# Patient Record
Sex: Male | Born: 1965
Health system: Southern US, Community
[De-identification: ages and names within clinical notes are randomized; demographics above are authoritative.]

## PROBLEM LIST (undated history)

## (undated) DIAGNOSIS — I10 Essential (primary) hypertension: Secondary | ICD-10-CM

## (undated) DIAGNOSIS — E785 Hyperlipidemia, unspecified: Secondary | ICD-10-CM

## (undated) DIAGNOSIS — E669 Obesity, unspecified: Secondary | ICD-10-CM

## (undated) DIAGNOSIS — G43909 Migraine, unspecified, not intractable, without status migrainosus: Secondary | ICD-10-CM

## (undated) DIAGNOSIS — E1169 Type 2 diabetes mellitus with other specified complication: Secondary | ICD-10-CM

## (undated) DIAGNOSIS — M7502 Adhesive capsulitis of left shoulder: Secondary | ICD-10-CM

## (undated) HISTORY — DX: Migraine, unspecified, not intractable, without status migrainosus: G43.909

## (undated) HISTORY — DX: Type 2 diabetes mellitus with other specified complication: E66.9

## (undated) HISTORY — DX: Obesity, unspecified: E11.69

## (undated) HISTORY — PX: KIDNEY SURGERY: SHX687

## (undated) HISTORY — DX: Hyperlipidemia, unspecified: E78.5

---
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
## 2017-09-04 ENCOUNTER — Ambulatory Visit (INDEPENDENT_AMBULATORY_CARE_PROVIDER_SITE_OTHER): Payer: 59 | Admitting: Podiatry

## 2017-09-04 ENCOUNTER — Encounter: Payer: Self-pay | Admitting: Podiatry

## 2017-09-04 DIAGNOSIS — M79676 Pain in unspecified toe(s): Secondary | ICD-10-CM

## 2017-09-04 DIAGNOSIS — L600 Ingrowing nail: Secondary | ICD-10-CM

## 2017-09-04 NOTE — Progress Notes (Signed)
  Subjective:  Patient ID: Nathan Brandt, male    DOB: 11-Aug-1965,  MRN: [id number]  Chief Complaint  Patient presents with  . Nail Problem    bilateral great toenails thick and curling together  \ 52 y.o. male presents with the above complaint.  Reports pain to both great toenails.  Reports the nails are very thick and painful.  Nondiabetic.  Has tried to cut his nails himself without relief.  Denies nausea vomiting fever chills. No past medical history on file.  No current outpatient medications on file.  Allergies no known allergies Review of Systems Objective:  There were no vitals filed for this visit. General AA&O x3. Normal mood and affect.  Vascular Dorsalis pedis and posterior tibial pulses  present 2+ bilaterally  Capillary refill normal to all digits. Pedal hair growth normal.  Neurologic Epicritic sensation grossly present.  Dermatologic No open lesions. Interspaces clear of maceration. 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  Pain to palpation about medial lateral nail borders bilaterally.  Orthopedic: MMT 5/5 in dorsiflexion, plantarflexion, inversion, and eversion. Normal joint ROM without pain or crepitus.   Assessment & Plan:  Patient was evaluated and treated and all questions answered.  Pincer nails bilateral great toes, right worse than left -Discussed performing permanent nail avulsion of both borders of the right great toenail due to pincer nail deformity.  Patient understands and wishes to proceed.  See below.  Will hold off performing procedure on left foot at this time, slant back performed at both nail borders, will consider at next visit performing similar procedure should the nails be painful.  Procedure: Excision of Ingrown Toenail Location: Right 1st toe both nail borders. Anesthesia: Lidocaine 1% plain; 1.665mL and Marcaine 0.5% plain; 1.655mL, digital block. Skin Prep: Betadine. Dressing: Silvadene; telfa; dry, sterile,  compression dressing. Technique: Following skin prep, the toe was exsanguinated and a tourniquet was secured at the base of the toe. The affected nail border was freed, split with a nail splitter, and excised. Chemical matrixectomy was then performed with phenol and irrigated out with alcohol. The tourniquet was then removed and sterile dressing applied. Disposition: Patient tolerated procedure well. Patient to return in 2 weeks for follow-up.   Return in about 2 weeks (around 09/18/2017) for Nail Check.

## 2017-09-04 NOTE — Patient Instructions (Signed)

## 2017-09-19 ENCOUNTER — Ambulatory Visit: Payer: 59 | Admitting: Podiatry

## 2017-11-27 ENCOUNTER — Encounter: Payer: Self-pay | Admitting: Medical

## 2017-11-27 ENCOUNTER — Ambulatory Visit (INDEPENDENT_AMBULATORY_CARE_PROVIDER_SITE_OTHER): Payer: 59 | Admitting: Medical

## 2017-11-27 VITALS — BP 155/90 | HR 72 | Temp 97.8°F | Resp 16 | Ht 63.0 in | Wt 246.4 lb

## 2017-11-27 DIAGNOSIS — Z125 Encounter for screening for malignant neoplasm of prostate: Secondary | ICD-10-CM | POA: Diagnosis not present

## 2017-11-27 DIAGNOSIS — Z0000 Encounter for general adult medical examination without abnormal findings: Secondary | ICD-10-CM | POA: Diagnosis not present

## 2017-11-27 DIAGNOSIS — Z23 Encounter for immunization: Secondary | ICD-10-CM | POA: Diagnosis not present

## 2017-11-27 DIAGNOSIS — Z1211 Encounter for screening for malignant neoplasm of colon: Secondary | ICD-10-CM | POA: Diagnosis not present

## 2017-11-27 DIAGNOSIS — Z113 Encounter for screening for infections with a predominantly sexual mode of transmission: Secondary | ICD-10-CM | POA: Diagnosis not present

## 2017-11-27 NOTE — Progress Notes (Signed)
Subjective:    Patient ID: Nathan Brandt, male    DOB: 1965-10-27, 52 y.o.   MRN: [id number]  HPI   Pt here for first time.  Pt  Wife works for [employer]. Pt is doing interior trim work around area presently. He is starting new job at [employer] upcoming week. Pt is from [place]. Pt is not exercising apart from work and yard work. Pt admits not eating healthy. Non smoker. 2 glasses of wine every 3 months.  Pt bp high initially today. Pt never had high bp before.  Pt due for tdap. He is willing to get today.  Pt never had colonoscopy.  Pt ok with getting hiv screen.   Review of Systems  Constitutional: Negative for chills, fatigue and fever.  HENT: Negative for congestion, ear discharge, facial swelling and hearing loss.   Respiratory: Negative for cough, chest tightness and wheezing.   Cardiovascular: Negative for chest pain and palpitations.  Gastrointestinal: Negative for abdominal distention, abdominal pain, constipation, nausea and vomiting.  Endocrine: Negative for polydipsia, polyphagia and polyuria.  Genitourinary: Negative for difficulty urinating, dysuria, frequency, hematuria, penile pain, penile swelling and urgency.  Musculoskeletal: Negative for arthralgias and back pain.  Skin: Negative for rash.  Neurological: Negative for dizziness, syncope, speech difficulty, weakness, numbness and headaches.       Occasional migraine ha since his 20's. No current ha.  Describes some neck tension and light sensitivity.  Hematological: Negative for adenopathy. Does not bruise/bleed easily.  Psychiatric/Behavioral: Negative for behavioral problems, confusion and sleep disturbance. The patient is not nervous/anxious.     History reviewed. No pertinent past medical history.   Social History   Socioeconomic History  . Marital status: Unknown    Spouse name: Not on file  . Number of children: Not on file  . Years of education: Not on file  . Highest education level: Not on  file  Occupational History  . Not on file  Social Needs  . Financial resource strain: Not on file  . Food insecurity:    Worry: Not on file    Inability: Not on file  . Transportation needs:    Medical: Not on file    Non-medical: Not on file  Tobacco Use  . Smoking status: Never Smoker  . Smokeless tobacco: Never Used  Substance and Sexual Activity  . Alcohol use: Never    Frequency: Never  . Drug use: Never  . Sexual activity: Not on file  Lifestyle  . Physical activity:    Days per week: Not on file    Minutes per session: Not on file  . Stress: Not on file  Relationships  . Social connections:    Talks on phone: Not on file    Gets together: Not on file    Attends religious service: Not on file    Active member of club or organization: Not on file    Attends meetings of clubs or organizations: Not on file    Relationship status: Not on file  . Intimate partner violence:    Fear of current or ex partner: Not on file    Emotionally abused: Not on file    Physically abused: Not on file    Forced sexual activity: Not on file  Other Topics Concern  . Not on file  Social History Narrative  . Not on file    Past Surgical History:  Procedure Laterality Date  . KIDNEY SURGERY      Family History  Problem Relation Age of Onset  . Cancer Mother   . Diabetes Father     No Known Allergies  No current outpatient medications on file prior to visit.   No current facility-administered medications on file prior to visit.     BP (!) 173/93   Pulse 72   Temp 97.8 F (36.6 C) (Oral)   Resp 16   Ht 5\' 3"  (1.6 m)   Wt 246 lb 6.4 oz (111.8 kg)   SpO2 99%   BMI 43.65 kg/m      Objective:   Physical Exam  General Mental Status- Alert. General Appearance- Not in acute distress.   Skin General: Color- Normal Color. Moisture- Normal Moisture.  Neck Carotid Arteries- Normal color. Moisture- Normal Moisture. No carotid bruits. No JVD.  Chest and Lung  Exam Auscultation: Breath Sounds:-Normal.  Cardiovascular Auscultation:Rythm- Regular. Murmurs & Other Heart Sounds:Auscultation of the heart reveals- No Murmurs.  Abdomen Inspection:-Inspeection Normal. Palpation/Percussion:Note:No mass. Palpation and Percussion of the abdomen reveal- Non Tender, Non Distended + BS, no rebound or guarding.    Neurologic Cranial Nerve exam:- CN III-XII intact(No nystagmus), symmetric smile. Finger to Nose:- Normal/Intact Strength:- 5/5 equal and symmetric strength both upper and lower extremities.  Genital exam- no hernias, normal testicles. Rectal- smooth, not enlarged prostate. No nodules. Stool card negative for blood.    Assessment & Plan:  For you wellness exam today I have ordered cbc, cmp, lipid panel, ua and hiv.(future labs. Schedule at check out)  Vaccine given today. tdap  Recommend exercise and healthy diet.  We will let you know lab results as they come in.  Follow up date appointment will be determined after lab review.   Recommend low salt diet, and weight loss. Recheck bp in 2 weeks. May need bp medication.  Esperanza Richters, PA-C

## 2017-11-27 NOTE — Patient Instructions (Addendum)
For you wellness exam today I have ordered cbc, cmp, lipid panel, ua and hiv.(future labs. Schedule at check out)  Vaccine given today. tdap  Recommend exercise and healthy diet.  We will let you know lab results as they come in.  Follow up date appointment will be determined after lab review.   Recommend low salt diet, and weight loss. Recheck bp in 2 weeks. May need bp medication.   Preventive Care 40-64 Years, Male Preventive care refers to lifestyle choices and visits with your health care provider that can promote health and wellness. What does preventive care include?  A yearly physical exam. This is also called an annual well check.  Dental exams once or twice a year.  Routine eye exams. Ask your health care provider how often you should have your eyes checked.  Personal lifestyle choices, including: ? Daily care of your teeth and gums. ? Regular physical activity. ? Eating a healthy diet. ? Avoiding tobacco and drug use. ? Limiting alcohol use. ? Practicing safe sex. ? Taking low-dose aspirin every day starting at age 60. What happens during an annual well check? The services and screenings done by your health care provider during your annual well check will depend on your age, overall health, lifestyle risk factors, and family history of disease. Counseling Your health care provider may ask you questions about your:  Alcohol use.  Tobacco use.  Drug use.  Emotional well-being.  Home and relationship well-being.  Sexual activity.  Eating habits.  Work and work Statistician.  Screening You may have the following tests or measurements:  Height, weight, and BMI.  Blood pressure.  Lipid and cholesterol levels. These may be checked every 5 years, or more frequently if you are over 52 years old.  Skin check.  Lung cancer screening. You may have this screening every year starting at age 90 if you have a 30-pack-year history of smoking and currently smoke  or have quit within the past 15 years.  Fecal occult blood test (FOBT) of the stool. You may have this test every year starting at age 48.  Flexible sigmoidoscopy or colonoscopy. You may have a sigmoidoscopy every 5 years or a colonoscopy every 10 years starting at age 42.  Prostate cancer screening. Recommendations will vary depending on your family history and other risks.  Hepatitis C blood test.  Hepatitis B blood test.  Sexually transmitted disease (STD) testing.  Diabetes screening. This is done by checking your blood sugar (glucose) after you have not eaten for a while (fasting). You may have this done every 1-3 years.  Discuss your test results, treatment options, and if necessary, the need for more tests with your health care provider. Vaccines Your health care provider may recommend certain vaccines, such as:  Influenza vaccine. This is recommended every year.  Tetanus, diphtheria, and acellular pertussis (Tdap, Td) vaccine. You may need a Td booster every 10 years.  Varicella vaccine. You may need this if you have not been vaccinated.  Zoster vaccine. You may need this after age 56.  Measles, mumps, and rubella (MMR) vaccine. You may need at least one dose of MMR if you were born in 1957 or later. You may also need a second dose.  Pneumococcal 13-valent conjugate (PCV13) vaccine. You may need this if you have certain conditions and have not been vaccinated.  Pneumococcal polysaccharide (PPSV23) vaccine. You may need one or two doses if you smoke cigarettes or if you have certain conditions.  Meningococcal vaccine.  You may need this if you have certain conditions.  Hepatitis A vaccine. You may need this if you have certain conditions or if you travel or work in places where you may be exposed to hepatitis A.  Hepatitis B vaccine. You may need this if you have certain conditions or if you travel or work in places where you may be exposed to hepatitis B.  Haemophilus  influenzae type b (Hib) vaccine. You may need this if you have certain risk factors.  Talk to your health care provider about which screenings and vaccines you need and how often you need them. This information is not intended to replace advice given to you by your health care provider. Make sure you discuss any questions you have with your health care provider. Document Released: 06/23/2015 Document Revised: 02/14/2016 Document Reviewed: 03/28/2015 Elsevier Interactive Patient Education  2018 [place] Eating Plan DASH stands for "Dietary Approaches to Stop Hypertension." The DASH eating plan is a healthy eating plan that has been shown to reduce high blood pressure (hypertension). It may also reduce your risk for type 2 diabetes, heart disease, and stroke. The DASH eating plan may also help with weight loss. What are tips for following this plan? General guidelines Avoid eating more than 2,300 mg (milligrams) of salt (sodium) a day. If you have hypertension, you may need to reduce your sodium intake to 1,500 mg a day. Limit alcohol intake to no more than 1 drink a day for nonpregnant women and 2 drinks a day for men. One drink equals 12 oz of beer, 5 oz of wine, or 1 oz of hard liquor. Work with your health care provider to maintain a healthy body weight or to lose weight. Ask what an ideal weight is for you. Get at least 30 minutes of exercise that causes your heart to beat faster (aerobic exercise) most days of the week. Activities may include walking, swimming, or biking. Work with your health care provider or diet and nutrition specialist (dietitian) to adjust your eating plan to your individual calorie needs. Reading food labels Check food labels for the amount of sodium per serving. Choose foods with less than 5 percent of the Daily Value of sodium. Generally, foods with less than 300 mg of sodium per serving fit into this eating plan. To find whole grains, look for the  word "whole" as the first word in the ingredient list. Shopping Buy products labeled as "low-sodium" or "no salt added." Buy fresh foods. Avoid canned foods and premade or frozen meals. Cooking Avoid adding salt when cooking. Use salt-free seasonings or herbs instead of table salt or sea salt. Check with your health care provider or pharmacist before using salt substitutes. Do not fry foods. Cook foods using healthy methods such as baking, boiling, grilling, and broiling instead. Cook with heart-healthy oils, such as olive, canola, soybean, or sunflower oil. Meal planning  Eat a balanced diet that includes: 5 or more servings of fruits and vegetables each day. At each meal, try to fill half of your plate with fruits and vegetables. Up to 6-8 servings of whole grains each day. Less than 6 oz of lean meat, poultry, or fish each day. A 3-oz serving of meat is about the same size as a deck of cards. One egg equals 1 oz. 2 servings of low-fat dairy each day. A serving of nuts, seeds, or beans 5 times each week. Heart-healthy fats. Healthy fats called Omega-3 fatty acids are  found in foods such as flaxseeds and coldwater fish, like sardines, salmon, and mackerel. Limit how much you eat of the following: Canned or prepackaged foods. Food that is high in trans fat, such as fried foods. Food that is high in saturated fat, such as fatty meat. Sweets, desserts, sugary drinks, and other foods with added sugar. Full-fat dairy products. Do not salt foods before eating. Try to eat at least 2 vegetarian meals each week. Eat more home-cooked food and less restaurant, buffet, and fast food. When eating at a restaurant, ask that your food be prepared with less salt or no salt, if possible. What foods are recommended? The items listed may not be a complete list. Talk with your dietitian about what dietary choices are best for you. Grains Whole-grain or whole-wheat bread. Whole-grain or whole-wheat pasta.  Brown rice. [place]. Bulgur. Whole-grain and low-sodium cereals. Pita bread. Low-fat, low-sodium crackers. Whole-wheat flour tortillas. Vegetables Fresh or frozen vegetables (raw, steamed, roasted, or grilled). Low-sodium or reduced-sodium tomato and vegetable juice. Low-sodium or reduced-sodium tomato sauce and tomato paste. Low-sodium or reduced-sodium canned vegetables. Fruits All fresh, dried, or frozen fruit. Canned fruit in natural juice (without added sugar). Meat and other protein foods Skinless chicken or [place]. Ground chicken or [place]. Pork with fat trimmed off. Fish and seafood. Egg whites. Dried beans, peas, or lentils. Unsalted nuts, nut butters, and seeds. Unsalted canned beans. Lean cuts of beef with fat trimmed off. Low-sodium, lean deli meat. Dairy Low-fat (1%) or fat-free (skim) milk. Fat-free, low-fat, or reduced-fat cheeses. Nonfat, low-sodium ricotta or cottage cheese. Low-fat or nonfat yogurt. Low-fat, low-sodium cheese. Fats and oils Soft margarine without trans fats. Vegetable oil. Low-fat, reduced-fat, or light mayonnaise and salad dressings (reduced-sodium). Canola, safflower, olive, soybean, and sunflower oils. Avocado. Seasoning and other foods Herbs. Spices. Seasoning mixes without salt. Unsalted popcorn and pretzels. Fat-free sweets. What foods are not recommended? The items listed may not be a complete list. Talk with your dietitian about what dietary choices are best for you. Grains Baked goods made with fat, such as croissants, muffins, or some breads. Dry pasta or rice meal packs. Vegetables Creamed or fried vegetables. Vegetables in a cheese sauce. Regular canned vegetables (not low-sodium or reduced-sodium). Regular canned tomato sauce and paste (not low-sodium or reduced-sodium). Regular tomato and vegetable juice (not low-sodium or reduced-sodium). Angie Fava. Olives. Fruits Canned fruit in a light or heavy syrup. Fried fruit. Fruit in cream or butter  sauce. Meat and other protein foods Fatty cuts of meat. Ribs. Fried meat. Berniece Salines. Sausage. Bologna and other processed lunch meats. Salami. Fatback. Hotdogs. Bratwurst. Salted nuts and seeds. Canned beans with added salt. Canned or smoked fish. Whole eggs or egg yolks. Chicken or [place] with skin. Dairy Whole or 2% milk, cream, and half-and-half. Whole or full-fat cream cheese. Whole-fat or sweetened yogurt. Full-fat cheese. Nondairy creamers. Whipped toppings. Processed cheese and cheese spreads. Fats and oils Butter. Stick margarine. Lard. Shortening. Ghee. Bacon fat. Tropical oils, such as coconut, palm kernel, or palm oil. Seasoning and other foods Salted popcorn and pretzels. Onion salt, garlic salt, seasoned salt, table salt, and sea salt. Worcestershire sauce. Tartar sauce. Barbecue sauce. Teriyaki sauce. Soy sauce, including reduced-sodium. Steak sauce. Canned and packaged gravies. Fish sauce. Oyster sauce. Cocktail sauce. Horseradish that you find on the shelf. Ketchup. Mustard. Meat flavorings and tenderizers. Bouillon cubes. Hot sauce and Tabasco sauce. Premade or packaged marinades. Premade or packaged taco seasonings. Relishes. Regular salad dressings. Where to find more information: [employer]  Heart, Lung, and Blood Institute: https://wilson-eaton.com/ American Heart Association: www.heart.org Summary The DASH eating plan is a healthy eating plan that has been shown to reduce high blood pressure (hypertension). It may also reduce your risk for type 2 diabetes, heart disease, and stroke. With the DASH eating plan, you should limit salt (sodium) intake to 2,300 mg a day. If you have hypertension, you may need to reduce your sodium intake to 1,500 mg a day. When on the DASH eating plan, aim to eat more fresh fruits and vegetables, whole grains, lean proteins, low-fat dairy, and heart-healthy fats. Work with your health care provider or diet and nutrition specialist (dietitian) to adjust your eating  plan to your individual calorie needs. This information is not intended to replace advice given to you by your health care provider. Make sure you discuss any questions you have with your health care provider. Document Released: 05/16/2011 Document Revised: 05/20/2016 Document Reviewed: 05/20/2016 Elsevier Interactive Patient Education  [employer].

## 2017-11-28 ENCOUNTER — Other Ambulatory Visit (INDEPENDENT_AMBULATORY_CARE_PROVIDER_SITE_OTHER): Payer: 59

## 2017-11-28 DIAGNOSIS — Z113 Encounter for screening for infections with a predominantly sexual mode of transmission: Secondary | ICD-10-CM

## 2017-11-28 DIAGNOSIS — Z125 Encounter for screening for malignant neoplasm of prostate: Secondary | ICD-10-CM

## 2017-11-28 DIAGNOSIS — Z0000 Encounter for general adult medical examination without abnormal findings: Secondary | ICD-10-CM | POA: Diagnosis not present

## 2017-11-28 LAB — CBC WITH DIFFERENTIAL/PLATELET
BASOS ABS: 0 10*3/uL (ref 0.0–0.1)
BASOS PCT: 0.9 % (ref 0.0–3.0)
Eosinophils Absolute: 0.2 10*3/uL (ref 0.0–0.7)
Eosinophils Relative: 4 % (ref 0.0–5.0)
HEMATOCRIT: 43.1 % (ref 39.0–52.0)
Hemoglobin: 14.8 g/dL (ref 13.0–17.0)
LYMPHS ABS: 1.4 10*3/uL (ref 0.7–4.0)
Lymphocytes Relative: 30.5 % (ref 12.0–46.0)
MCHC: 34.3 g/dL (ref 30.0–36.0)
MCV: 87.6 fl (ref 78.0–100.0)
MONOS PCT: 11.5 % (ref 3.0–12.0)
Monocytes Absolute: 0.5 10*3/uL (ref 0.1–1.0)
NEUTROS ABS: 2.4 10*3/uL (ref 1.4–7.7)
NEUTROS PCT: 53.1 % (ref 43.0–77.0)
PLATELETS: 240 10*3/uL (ref 150.0–400.0)
RBC: 4.92 Mil/uL (ref 4.22–5.81)
RDW: 13.7 % (ref 11.5–15.5)
WBC: 4.6 10*3/uL (ref 4.0–10.5)

## 2017-11-28 LAB — COMPREHENSIVE METABOLIC PANEL
ALT: 34 U/L (ref 0–53)
AST: 20 U/L (ref 0–37)
Albumin: 4.2 g/dL (ref 3.5–5.2)
Alkaline Phosphatase: 61 U/L (ref 39–117)
BILIRUBIN TOTAL: 0.5 mg/dL (ref 0.2–1.2)
BUN: 22 mg/dL (ref 6–23)
CHLORIDE: 105 meq/L (ref 96–112)
CO2: 28 mEq/L (ref 19–32)
CREATININE: 1.01 mg/dL (ref 0.40–1.50)
Calcium: 9.3 mg/dL (ref 8.4–10.5)
GFR: 82.86 mL/min (ref >60.00)
GLUCOSE: 103 mg/dL — AB (ref 70–99)
Potassium: 4 mEq/L (ref 3.5–5.1)
Sodium: 141 mEq/L (ref 135–145)
Total Protein: 6.8 g/dL (ref 6.0–8.3)

## 2017-11-28 LAB — LIPID PANEL
Cholesterol: 237 mg/dL — ABNORMAL HIGH (ref 0–200)
HDL: 56.3 mg/dL (ref >39.00)
LDL CALC: 160 mg/dL — AB (ref 0–99)
NONHDL: 181.09
Total CHOL/HDL Ratio: 4
Triglycerides: 106 mg/dL (ref 0.0–149.0)
VLDL: 21.2 mg/dL (ref 0.0–40.0)

## 2017-11-28 LAB — URINALYSIS, ROUTINE W REFLEX MICROSCOPIC
Bilirubin Urine: NEGATIVE
HGB URINE DIPSTICK: NEGATIVE
KETONES UR: NEGATIVE
Leukocytes, UA: NEGATIVE
NITRITE: NEGATIVE
Specific Gravity, Urine: 1.02 (ref 1.000–1.030)
TOTAL PROTEIN, URINE-UPE24: NEGATIVE
URINE GLUCOSE: NEGATIVE
Urobilinogen, UA: 0.2 (ref 0.0–1.0)
pH: 6.5 (ref 5.0–8.0)

## 2017-11-28 LAB — PSA: PSA: 1.24 ng/mL (ref 0.10–4.00)

## 2017-11-29 LAB — HIV ANTIBODY (ROUTINE TESTING W REFLEX): HIV: NONREACTIVE

## 2017-12-12 ENCOUNTER — Ambulatory Visit: Payer: 59 | Admitting: Medical

## 2017-12-18 ENCOUNTER — Encounter: Payer: Self-pay | Admitting: Medical

## 2017-12-18 ENCOUNTER — Ambulatory Visit (INDEPENDENT_AMBULATORY_CARE_PROVIDER_SITE_OTHER): Payer: 59 | Admitting: Medical

## 2017-12-18 VITALS — BP 160/105 | HR 66 | Temp 98.1°F | Resp 16 | Ht 63.0 in | Wt 243.4 lb

## 2017-12-18 DIAGNOSIS — I10 Essential (primary) hypertension: Secondary | ICD-10-CM | POA: Diagnosis not present

## 2017-12-18 DIAGNOSIS — E785 Hyperlipidemia, unspecified: Secondary | ICD-10-CM

## 2017-12-18 DIAGNOSIS — R739 Hyperglycemia, unspecified: Secondary | ICD-10-CM | POA: Diagnosis not present

## 2017-12-18 MED ORDER — LOSARTAN POTASSIUM 100 MG PO TABS: 100.0000 mg | ORAL_TABLET | Freq: Every day | ORAL | 3 refills | Status: DC

## 2017-12-18 NOTE — Progress Notes (Signed)
Subjective:    Patient ID: Nathan Brandt, male    DOB: 1965-09-30, 52 y.o.   MRN: [id number]  HPI  Pt in for bp check. His bp is still high. No cardiac or neurologic signs or symptoms. He has not checked bp since last visit.  Pt cholesterol was high last visit.Marland Kitchen He has improved diet but did not start fish oil.  Pt sugar was mild elevated recently. One time in past random sugar 238 about 2 years ago   Review of Systems  Constitutional: Negative for chills, fatigue and fever.  HENT: Negative for congestion, ear pain and facial swelling.   Respiratory: Negative for cough, chest tightness and wheezing.   Cardiovascular: Negative for chest pain and palpitations.  Gastrointestinal: Negative for abdominal distention and abdominal pain.  Musculoskeletal: Negative for back pain and neck pain.  Skin: Negative for rash.  Neurological: Negative for dizziness, numbness and headaches.  Hematological: Does not bruise/bleed easily.  Psychiatric/Behavioral: Negative for behavioral problems and confusion.    No past medical history on file.   Social History   Socioeconomic History  . Marital status: Unknown    Spouse name: Not on file  . Number of children: Not on file  . Years of education: Not on file  . Highest education level: Not on file  Occupational History  . Not on file  Social Needs  . Financial resource strain: Not on file  . Food insecurity:    Worry: Not on file    Inability: Not on file  . Transportation needs:    Medical: Not on file    Non-medical: Not on file  Tobacco Use  . Smoking status: Never Smoker  . Smokeless tobacco: Never Used  Substance and Sexual Activity  . Alcohol use: Yes    Frequency: Never    Comment: 2 glasses of wine every 3 months.  . Drug use: Never  . Sexual activity: Yes  Lifestyle  . Physical activity:    Days per week: Not on file    Minutes per session: Not on file  . Stress: Not on file  Relationships  . Social connections:   Talks on phone: Not on file    Gets together: Not on file    Attends religious service: Not on file    Active member of club or organization: Not on file    Attends meetings of clubs or organizations: Not on file    Relationship status: Not on file  . Intimate partner violence:    Fear of current or ex partner: Not on file    Emotionally abused: Not on file    Physically abused: Not on file    Forced sexual activity: Not on file  Other Topics Concern  . Not on file  Social History Narrative  . Not on file    Past Surgical History:  Procedure Laterality Date  . KIDNEY SURGERY    . KIDNEY SURGERY     left side surgery. 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    Family History  Problem Relation Age of Onset  . Cancer Mother   . Diabetes Father     No Known Allergies  No current outpatient medications on file prior to visit.   No current facility-administered medications on file prior to visit.     BP (!) 166/106   Pulse 66   Temp 98.1 F (36.7 C) (Oral)   Resp 16   Ht 5\' 3"  (1.6 m)   Wt 243 lb  6.4 oz (110.4 kg)   SpO2 98%   BMI 43.12 kg/m      Objective:   Physical Exam  General Mental Status- Alert. General Appearance- Not in acute distress.   Skin General: Color- Normal Color. Moisture- Normal Moisture.  Neck Carotid Arteries- Normal color. Moisture- Normal Moisture. No carotid bruits. No JVD.  Chest and Lung Exam Auscultation: Breath Sounds:-Normal.  Cardiovascular Auscultation:Rythm- Regular. Murmurs & Other Heart Sounds:Auscultation of the heart reveals- No Murmurs.  Abdomen Inspection:-Inspeection Normal. Palpation/Percussion:Note:No mass. Palpation and Percussion of the abdomen reveal- Non Tender, Non Distended + BS, no rebound or guarding.   Neurologic Cranial Nerve exam:- CN III-XII intact(No nystagmus), symmetric smile. tStrength:- 5/5 equal and symmetric strength both upper and lower extremities.      Assessment & Plan:    Your blood pressure is still high today.  I do think it is a good idea to start blood pressure medication to protect your heart, kidney function and eyes.  Reduce cardiovascular risk as well.  Your cholesterol has been high recently as well.  Please eat low-cholesterol diet and get fish oil tablets over-the-counter and we discussed.  Sugar has been mildly elevated recently and a moderate to high about 2 years ago.  So placed order to get A1c today.  Please get blood pressure cuff and start to check your blood pressure 2-3 times a week.  Check when relaxed.  Also check if you are symptomatic.  Asked that you schedule a nurse blood pressure check in 2 to 3 weeks.  We will review that the blood pressure reading and see if you need any modification.  Esperanza RichtersEdward Gadiel John, PA-C

## 2017-12-18 NOTE — Patient Instructions (Addendum)
Your blood pressure is still high today.  I do think it is a good idea to start blood pressure medication to protect your heart, kidney function and eyes.  Reduce cardiovascular risk as well.  Your cholesterol has been high recently as well.  Please eat low-cholesterol diet and get fish oil tablets over-the-counter and we discussed.  Sugar has been mildly elevated recently and a moderate to high about 2 years ago.  So placed order to get A1c today.  Please get blood pressure cuff and start to check your blood pressure 2-3 times a week.  Check when relaxed.  Also check if you are symptomatic.  Asked that you schedule a nurse blood pressure check in 2 to 3 weeks.  We will review that the blood pressure reading and see if you need any modification.

## 2017-12-19 ENCOUNTER — Other Ambulatory Visit (INDEPENDENT_AMBULATORY_CARE_PROVIDER_SITE_OTHER): Payer: 59

## 2017-12-19 ENCOUNTER — Telehealth: Payer: Self-pay

## 2017-12-19 DIAGNOSIS — R739 Hyperglycemia, unspecified: Secondary | ICD-10-CM | POA: Diagnosis not present

## 2017-12-19 DIAGNOSIS — I10 Essential (primary) hypertension: Secondary | ICD-10-CM

## 2017-12-19 LAB — HEMOGLOBIN A1C: HEMOGLOBIN A1C: 6.2 % (ref 4.6–6.5)

## 2017-12-19 MED ORDER — LOSARTAN POTASSIUM 100 MG PO TABS: 100.0000 mg | ORAL_TABLET | Freq: Every day | ORAL | 3 refills | Status: DC

## 2017-12-19 MED FILL — LOSARTAN POTASSIUM 100 MG T: 100 | 90 days supply | Qty: 90 | Fill #0

## 2017-12-19 NOTE — Telephone Encounter (Signed)
Thanks for sending rx. 

## 2017-12-19 NOTE — Telephone Encounter (Signed)
Losartan rx sent to [facility] per pt. request. Medication sent to [facility] on 7/11 cancelled by author over the phone.

## 2017-12-29 ENCOUNTER — Encounter: Payer: Self-pay | Admitting: Gastroenterology

## 2018-01-09 ENCOUNTER — Ambulatory Visit (INDEPENDENT_AMBULATORY_CARE_PROVIDER_SITE_OTHER): Payer: 59 | Admitting: Family Medicine

## 2018-01-09 VITALS — BP 134/89 | HR 76

## 2018-01-09 DIAGNOSIS — I10 Essential (primary) hypertension: Secondary | ICD-10-CM

## 2018-01-09 NOTE — Progress Notes (Signed)
Reviewed  Jamel Dunton R Lowne Chase, DO  

## 2018-01-09 NOTE — Progress Notes (Signed)
Pre visit review using our clinic tool,if applicable. No additional management support is needed unless otherwise documented below in the visit note.   Pt here for Blood pressure check per order from Whole FoodsEdward Saguier, PA-C  Pt currently takes:Losartan 100 mg daily. No complaints voiced this visit.   Pt reports compliance with medication.  BP today @ = 134/89  P = 76  Pt advised per Dr. Zola ButtonLowne-Chase DOD to follow the Dash Diet, continue to take Losartan 100 mg as ordered and return in 1 month for follow up visit with provider. Appointment scheduled.

## 2018-02-13 ENCOUNTER — Ambulatory Visit: Payer: 59 | Admitting: Medical

## 2018-03-05 ENCOUNTER — Encounter: Payer: 59 | Admitting: Gastroenterology

## 2018-03-24 ENCOUNTER — Encounter: Payer: Self-pay | Admitting: Medical

## 2018-05-13 ENCOUNTER — Other Ambulatory Visit: Payer: Self-pay

## 2018-05-13 ENCOUNTER — Emergency Department (HOSPITAL_BASED_OUTPATIENT_CLINIC_OR_DEPARTMENT_OTHER): Payer: 59

## 2018-05-13 ENCOUNTER — Emergency Department (HOSPITAL_BASED_OUTPATIENT_CLINIC_OR_DEPARTMENT_OTHER)
Admission: EM | Admit: 2018-05-13 | Discharge: 2018-05-13 | Disposition: A | Discharge status: Home / Self Care | Payer: 59 | Attending: Emergency Medicine | Admitting: Emergency Medicine

## 2018-05-13 ENCOUNTER — Encounter (HOSPITAL_BASED_OUTPATIENT_CLINIC_OR_DEPARTMENT_OTHER): Payer: Self-pay | Admitting: Emergency Medicine

## 2018-05-13 DIAGNOSIS — R51 Headache: Secondary | ICD-10-CM | POA: Insufficient documentation

## 2018-05-13 DIAGNOSIS — M25511 Pain in right shoulder: Secondary | ICD-10-CM | POA: Diagnosis not present

## 2018-05-13 DIAGNOSIS — M542 Cervicalgia: Secondary | ICD-10-CM | POA: Diagnosis not present

## 2018-05-13 DIAGNOSIS — M5412 Radiculopathy, cervical region: Secondary | ICD-10-CM | POA: Diagnosis not present

## 2018-05-13 DIAGNOSIS — I10 Essential (primary) hypertension: Secondary | ICD-10-CM | POA: Diagnosis not present

## 2018-05-13 DIAGNOSIS — R519 Headache, unspecified: Secondary | ICD-10-CM

## 2018-05-13 HISTORY — DX: Essential (primary) hypertension: I10

## 2018-05-13 LAB — BASIC METABOLIC PANEL
Anion gap: 8 (ref 5–15)
BUN: 20 mg/dL (ref 6–20)
CO2: 26 mmol/L (ref 22–32)
Calcium: 8.8 mg/dL — ABNORMAL LOW (ref 8.9–10.3)
Chloride: 104 mmol/L (ref 98–111)
Creatinine, Ser: 1.05 mg/dL (ref 0.61–1.24)
GFR calc Af Amer: >60 mL/min (ref >60)
GFR calc non Af Amer: >60 mL/min (ref >60)
Glucose, Bld: 110 mg/dL — ABNORMAL HIGH (ref 70–99)
POTASSIUM: 3.9 mmol/L (ref 3.5–5.1)
Sodium: 138 mmol/L (ref 135–145)

## 2018-05-13 LAB — CBC WITH DIFFERENTIAL/PLATELET
Abs Immature Granulocytes: 0.01 10*3/uL (ref 0.00–0.07)
BASOS ABS: 0 10*3/uL (ref 0.0–0.1)
Basophils Relative: 1 %
Eosinophils Absolute: 0.2 10*3/uL (ref 0.0–0.5)
Eosinophils Relative: 3 %
HCT: 45.8 % (ref 39.0–52.0)
Hemoglobin: 15 g/dL (ref 13.0–17.0)
IMMATURE GRANULOCYTES: 0 %
Lymphocytes Relative: 25 %
Lymphs Abs: 1.2 10*3/uL (ref 0.7–4.0)
MCH: 29.4 pg (ref 26.0–34.0)
MCHC: 32.8 g/dL (ref 30.0–36.0)
MCV: 89.8 fL (ref 80.0–100.0)
Monocytes Absolute: 0.7 10*3/uL (ref 0.1–1.0)
Monocytes Relative: 14 %
Neutro Abs: 2.7 10*3/uL (ref 1.7–7.7)
Neutrophils Relative %: 57 %
Platelets: 239 10*3/uL (ref 150–400)
RBC: 5.1 MIL/uL (ref 4.22–5.81)
RDW: 13 % (ref 11.5–15.5)
WBC: 4.7 10*3/uL (ref 4.0–10.5)
nRBC: 0 % (ref 0.0–0.2)

## 2018-05-13 MED ORDER — DEXAMETHASONE SODIUM PHOSPHATE 10 MG/ML IJ SOLN
10.0000 mg | Freq: Once | INTRAMUSCULAR | Status: AC
  Administered 2018-05-13: 10 mg via INTRAVENOUS
  Filled 2018-05-13: qty 1

## 2018-05-13 MED ORDER — SODIUM CHLORIDE 0.9 % IV BOLUS
1000.0000 mL | Freq: Once | INTRAVENOUS | Status: AC
  Administered 2018-05-13: 1000 mL via INTRAVENOUS

## 2018-05-13 MED ORDER — ONDANSETRON HCL 4 MG/2ML IJ SOLN
4.0000 mg | Freq: Once | INTRAMUSCULAR | Status: AC
  Administered 2018-05-13: 4 mg via INTRAVENOUS
  Filled 2018-05-13: qty 2

## 2018-05-13 MED ORDER — SODIUM CHLORIDE 0.9 % IV SOLN
INTRAVENOUS | Status: DC
  Administered 2018-05-13: 08:00:00 via INTRAVENOUS

## 2018-05-13 MED ORDER — KETOROLAC TROMETHAMINE 30 MG/ML IJ SOLN
30.0000 mg | Freq: Once | INTRAMUSCULAR | Status: AC
  Administered 2018-05-13: 30 mg via INTRAVENOUS
  Filled 2018-05-13: qty 1

## 2018-05-13 MED ORDER — CELECOXIB 200 MG PO CAPS: 200.0000 mg | ORAL_CAPSULE | Freq: Two times a day (BID) | ORAL | 0 refills | Status: DC

## 2018-05-13 MED ORDER — PREDNISONE 10 MG (21) PO TBPK: ORAL_TABLET | ORAL | 0 refills | Status: DC

## 2018-05-13 MED FILL — LOSARTAN POTASSIUM 100 MG T: 100 | 30 days supply | Qty: 30 | Fill #1

## 2018-05-13 MED FILL — predniSONE 10 MG TABS: 10 | 12 days supply | Qty: 42 | Fill #0

## 2018-05-13 MED FILL — CELECOXIB 200 MG CAP: 200 | 30 days supply | Qty: 60 | Fill #0

## 2018-05-13 NOTE — ED Notes (Signed)
NAD at this time. Pt is stable and going home.  

## 2018-05-13 NOTE — ED Triage Notes (Signed)
Pt c/o headache on right side of head. Pt also states he has chronic right shoulder pain that radiates up right side of neck.

## 2018-05-13 NOTE — Discharge Instructions (Addendum)
Get your blood pressure medications refilled. Eat a low sodium diet.

## 2018-05-13 NOTE — ED Provider Notes (Signed)
[facility] EMERGENCY DEPARTMENT Provider Note   CSN: [id number] Arrival date & time: 05/13/18  7829     History   Chief Complaint Chief Complaint  Patient presents with  . Headache    HPI Nathan Brandt is a 52 y.o. male.  Pt presents to the ED today with a headache.  He said it is on the right side of his head.  He has had headaches for years, but has not seen his doctor for his headaches.  He said this headache started yesterday, but got worse throughout the day.  He was unable to sleep last night.  The pt also has shoulder pain which radiates up to his neck.  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  The pt has not seen anyone about his shoulder pain.  He denies n/v or f/c.  Pt does have a hx of htn and bp is high this morning. He has not taken his bp meds in about 1 month.  He said he has refills on his prescription, but has not picked them up.     Past Medical History:  Diagnosis Date  . Hypertension     There are no active problems to display for this patient.   Past Surgical History:  Procedure Laterality Date  . KIDNEY SURGERY    . KIDNEY SURGERY     left side surgery. 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        Home Medications    Prior to Admission medications   Medication Sig Start Date End Date Taking? Authorizing Provider  celecoxib (CELEBREX) 200 MG capsule Take 1 capsule (200 mg total) by mouth 2 (two) times daily. 05/13/18   Jacalyn Lefevre, MD  predniSONE (STERAPRED UNI-PAK 21 TAB) 10 MG (21) TBPK tablet Take 6 tabs for 2 days, then 5 for 2 days, then 4 for 2 days, then 3 for 2 days, 2 for 2 days, then 1 for 2 days 05/13/18   Jacalyn Lefevre, MD    Family History Family History  Problem Relation Age of Onset  . Cancer Mother   . Diabetes Father     Social History Social History   Tobacco Use  . Smoking status: Never Smoker  .  Smokeless tobacco: Never Used  Substance Use Topics  . Alcohol use: Yes    Frequency: Never    Comment: 2 glasses of wine every 3 months.  . Drug use: Never     Allergies   Patient has no known allergies.   Review of Systems Review of Systems  Musculoskeletal:       Shoulder pain  Neurological: Positive for headaches.  All other systems reviewed and are negative.    Physical Exam Updated Vital Signs BP (!) 166/104   Pulse 73   Temp 97.7 F (36.5 C) (Oral)   Resp 18   Ht 5\' 4"  (1.626 m)   Wt 109.3 kg   SpO2 96%   BMI 41.37 kg/m   Physical Exam  Constitutional: He is oriented to person, place, and time. He appears well-developed and well-nourished.  HENT:  Head: Normocephalic and atraumatic.  Eyes: Pupils are equal, round, and reactive to light. EOM are normal.  Neck: Normal range of motion. Neck supple.  Cardiovascular: Normal rate, regular rhythm, normal heart sounds and intact distal pulses.  Pulmonary/Chest: Effort normal and breath sounds normal.  Abdominal: Soft. Bowel sounds are  normal.  Musculoskeletal: Normal range of motion.  Neurological: He is alert and oriented to person, place, and time. He has normal strength. He displays a negative Romberg sign.  Skin: Skin is warm and dry.  Psychiatric: He has a normal mood and affect. His behavior is normal.  Nursing note and vitals reviewed.    ED Treatments / Results  Labs (all labs ordered are listed, but only abnormal results are displayed) Labs Reviewed  BASIC METABOLIC PANEL - Abnormal; Notable for the following components:      Result Value   Glucose, Bld 110 (*)    Calcium 8.8 (*)    All other components within normal limits  CBC WITH DIFFERENTIAL/PLATELET    EKG None  Radiology Dg Cervical Spine Complete  Result Date: 05/13/2018 CLINICAL DATA:  Chronic RIGHT shoulder pain and RIGHT-sided neck pain. No known injuries. EXAM: CERVICAL SPINE - COMPLETE 4+ VIEW COMPARISON:  None. FINDINGS:  Anatomic alignment. No visible fractures. Mild disc space narrowing at C4-5. Remaining disc spaces well-preserved. Facet joints intact. No significant bony foraminal stenoses on the oblique views. No static evidence of instability. IMPRESSION: 1. Mild degenerative disc disease at C4-5. 2. Otherwise normal examination. Electronically Signed   By: Hulan Saas M.D.   On: 05/13/2018 08:06   Dg Shoulder Right  Result Date: 05/13/2018 CLINICAL DATA:  Chronic RIGHT shoulder pain and RIGHT-sided neck pain. No known injuries. EXAM: RIGHT SHOULDER - 2+ VIEW COMPARISON:  None. FINDINGS: No evidence of acute fracture or glenohumeral dislocation. Narrowed glenohumeral joint with hypertrophic spurring involving the INFERIOR humeral head and the INFERIOR glenoid. Subacromial space well-preserved. Acromioclavicular joint intact with mild degenerative changes. Bone mineral density well-preserved. IMPRESSION: Osteoarthritis involving the glenohumeral joint. Electronically Signed   By: Hulan Saas M.D.   On: 05/13/2018 08:03   Ct Head Wo Contrast  Result Date: 05/13/2018 CLINICAL DATA:  52 year old with two-month history of headaches, presenting with an acute severe headache described as "the worst headache of my life". EXAM: CT HEAD WITHOUT CONTRAST TECHNIQUE: Contiguous axial images were obtained from the base of the skull through the vertex without intravenous contrast. COMPARISON:  None. FINDINGS: Brain: Ventricular system normal in size and appearance for age. No mass lesion. No midline shift. No acute hemorrhage or hematoma. No extra-axial fluid collections. No evidence of acute infarction. No focal brain parenchymal abnormalities. Vascular: No hyperdense vessel.  No visible atherosclerosis. Skull: No skull fracture or other focal osseous abnormality involving the skull. Sinuses/Orbits: Visualized paranasal sinuses, bilateral mastoid air cells and bilateral middle ear cavities well-aerated. Visualized orbits  and globes normal in appearance. Other: None. IMPRESSION: Normal examination. Electronically Signed   By: Hulan Saas M.D.   On: 05/13/2018 08:01    Procedures Procedures (including critical care time)  Medications Ordered in ED Medications  sodium chloride 0.9 % bolus 1,000 mL (1,000 mLs Intravenous New Bag/Given 05/13/18 0755)    And  0.9 %  sodium chloride infusion ( Intravenous New Bag/Given 05/13/18 0757)  dexamethasone (DECADRON) injection 10 mg (has no administration in time range)  ketorolac (TORADOL) 30 MG/ML injection 30 mg (30 mg Intravenous Given 05/13/18 0757)  ondansetron (ZOFRAN) injection 4 mg (4 mg Intravenous Given 05/13/18 0757)     Initial Impression / Assessment and Plan / ED Course  I have reviewed the triage vital signs and the nursing notes.  Pertinent labs & imaging results that were available during my care of the patient were reviewed by me and considered in my medical decision  making (see chart for details).    Pt does have some disc space narrowing at C4-5, and I suspect a nerve impingement causing the headache and shoulder pain.  Shoulder does have some arthritis in it, but it does not hurt to move.    Pt will be started on decadron here and prednisone for home.  He is instructed to f/u with NS for his neck.  He is encouraged to pick up his refills and take his bp medication.  Eat a DASH diet.  Pain has improved with the above tx.  Return if worse.  Final Clinical Impressions(s) / ED Diagnoses   Final diagnoses:  Essential hypertension  Acute nonintractable headache, unspecified headache type  Cervical radiculopathy    ED Discharge Orders         Ordered    predniSONE (STERAPRED UNI-PAK 21 TAB) 10 MG (21) TBPK tablet     05/13/18 0826    celecoxib (CELEBREX) 200 MG capsule  2 times daily     05/13/18 [id number]           Jacalyn LefevreHaviland, Seraphim Affinito, MD 05/13/18 [phone]

## 2018-05-25 ENCOUNTER — Encounter: Payer: Self-pay | Admitting: Medical

## 2018-05-25 ENCOUNTER — Ambulatory Visit (INDEPENDENT_AMBULATORY_CARE_PROVIDER_SITE_OTHER): Payer: 59 | Admitting: Medical

## 2018-05-25 VITALS — BP 138/90 | HR 78 | Temp 97.8°F | Resp 16 | Ht 63.0 in | Wt 247.4 lb

## 2018-05-25 DIAGNOSIS — E785 Hyperlipidemia, unspecified: Secondary | ICD-10-CM

## 2018-05-25 DIAGNOSIS — R739 Hyperglycemia, unspecified: Secondary | ICD-10-CM | POA: Diagnosis not present

## 2018-05-25 DIAGNOSIS — I10 Essential (primary) hypertension: Secondary | ICD-10-CM | POA: Diagnosis not present

## 2018-05-25 DIAGNOSIS — M542 Cervicalgia: Secondary | ICD-10-CM | POA: Diagnosis not present

## 2018-05-25 DIAGNOSIS — Z1211 Encounter for screening for malignant neoplasm of colon: Secondary | ICD-10-CM

## 2018-05-25 NOTE — Progress Notes (Signed)
Subjective:    Patient ID: Nathan Brandt, male    DOB: [birth year], 52 y.o.   MRN: [id number]  HPI  Pt in for follow up.  [place]   Pt has htn.Marland Kitchen. He is on losartan. His blood pressure is elevated initially. But no gross motor or sensory function deficits. Pt is on losartan. Second check better  Pt sugars have been in prediabetic range.   Pt has high cholesterol. I wanted him to come in for repeat labs. He states work schedule too busy for him to come back in.  He went to ED early in month and he had work up for neck pain. He had xray done. It showed. ED thought possible nerve impingement causing the headache and shoulder pain.  Shoulder does have some arthritis in it, but it does not hurt to move.  Pt states prednisone made his uvala swollen. He states this occurred on second day of prednisone. He has been using celebrex. His pain is controlled.  IMPRESSION: 1. Mild degenerative disc disease at C4-5. 2. Otherwise normal examination.      Review of Systems  Constitutional: Negative for chills, fatigue and fever.  Respiratory: Negative for chest tightness, shortness of breath and wheezing.   Cardiovascular: Negative for chest pain and palpitations.  Gastrointestinal: Negative for abdominal pain, constipation, nausea and vomiting.  Musculoskeletal: Positive for neck pain. Negative for myalgias.       See hpi.  Skin: Negative for rash.  Neurological: Negative for dizziness, syncope, light-headedness and numbness.  Hematological: Negative for adenopathy. Does not bruise/bleed easily.  Psychiatric/Behavioral: Negative for behavioral problems and confusion.   Past Medical History:  Diagnosis Date  . Hypertension      Social History   Socioeconomic History  . Marital status: Married    Spouse name: Not on file  . Number of children: Not on file  . Years of education: Not on file  . Highest education level: Not on file  Occupational History  .  Not on file  Social Needs  . Financial resource strain: Not on file  . Food insecurity:    Worry: Not on file    Inability: Not on file  . Transportation needs:    Medical: Not on file    Non-medical: Not on file  Tobacco Use  . Smoking status: Never Smoker  . Smokeless tobacco: Never Used  Substance and Sexual Activity  . Alcohol use: Yes    Frequency: Never    Comment: 2 glasses of wine every 3 months.  . Drug use: Never  . Sexual activity: Yes  Lifestyle  . Physical activity:    Days per week: Not on file    Minutes per session: Not on file  . Stress: Not on file  Relationships  . Social connections:    Talks on phone: Not on file    Gets together: Not on file    Attends religious service: Not on file    Active member of club or organization: Not on file    Attends meetings of clubs or organizations: Not on file    Relationship status: Not on file  . Intimate partner violence:    Fear of current or ex partner: Not on file    Emotionally abused: Not on file    Physically abused: Not on file    Forced sexual activity: Not on file  Other Topics Concern  . Not on file  Social History Narrative  .  Not on file    Past Surgical History:  Procedure Laterality Date  . KIDNEY SURGERY    . KIDNEY SURGERY     left side surgery. 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    Family History  Problem Relation Age of Onset  . Cancer Mother   . Diabetes Father     Allergies  Allergen Reactions  . Prednisone Anaphylaxis    uvula swollen    Current Outpatient Medications on File Prior to Visit  Medication Sig Dispense Refill  . celecoxib (CELEBREX) 200 MG capsule Take 1 capsule (200 mg total) by mouth 2 (two) times daily. 60 capsule 0  . losartan (COZAAR) 100 MG tablet Take 100 mg by mouth daily.    . predniSONE (STERAPRED UNI-PAK 21 TAB) 10 MG (21) TBPK tablet Take 6 tabs for 2 days, then 5 for 2 days, then 4 for 2 days, then 3 for 2 days, 2 for 2 days, then 1 for  2 days 42 tablet 0   No current facility-administered medications on file prior to visit.     BP 138/90   Pulse 78   Temp 97.8 F (36.6 C) (Oral)   Resp 16   Ht 5\' 3"  (1.6 m)   Wt 247 lb 6.4 oz (112.2 kg)   SpO2 96%   BMI 43.82 kg/m       Objective:   Physical Exam   General Mental Status- Alert. General Appearance- Not in acute distress.   Skin General: Color- Normal Color. Moisture- Normal Moisture.  Neck Carotid Arteries- Normal color. Moisture- Normal Moisture. No carotid bruits. No JVD.  Chest and Lung Exam Auscultation: Breath Sounds:-Normal.  Cardiovascular Auscultation:Rythm- Regular. Murmurs & Other Heart Sounds:Auscultation of the heart reveals- No Murmurs.  Abdomen Inspection:-Inspeection Normal. Palpation/Percussion:Note:No mass. Palpation and Percussion of the abdomen reveal- Non Tender, Non Distended + BS, no rebound or guarding.    Neurologic Cranial Nerve exam:- CN III-XII intact(No nystagmus), symmetric smile. Strength:- 5/5 equal and symmetric strength both upper and lower extremities.     Assessment & Plan:   Your blood pressure is at good level when I rechecked. Better to be closer to 130/80. Recommend hold celebrex since not much neck pain and check bp 2-3 times over next week. BP may be better than today as nsaids can increase bp.  For high cholesterol will get fasting lipid panel today.  Also recheck you 3 month blood sugar average.  When labs back will up date you on cardiovascular risk score.  For neck pain see specialist. Can use celebrex if needed for neck pain. But as states above hold off presently.  Follow up date to be determined after lab review.  Esperanza Richters, PA-C

## 2018-05-25 NOTE — Patient Instructions (Signed)
Your blood pressure is at good level when I rechecked. Better to be closer to 130/80. Recommend hold celebrex since not much neck pain and check bp 2-3 times over next week. BP may be better than today as nsaids can increase bp.  For high cholesterol will get fasting lipid panel today.  Also recheck you 3 month blood sugar average.  When labs back will up date you on cardiovascular risk score.  For neck pain see specialist. Can use celebrex if needed for neck pain. But as states above hold off presently.  Follow up date to be determined after lab review.

## 2018-05-26 LAB — LIPID PANEL
Cholesterol: 236 mg/dL — ABNORMAL HIGH (ref 0–200)
HDL: 59.6 mg/dL (ref >39.00)
LDL Cholesterol: 151 mg/dL — ABNORMAL HIGH (ref 0–99)
NonHDL: 176.42
Total CHOL/HDL Ratio: 4
Triglycerides: 125 mg/dL (ref 0.0–149.0)
VLDL: 25 mg/dL (ref 0.0–40.0)

## 2018-05-26 LAB — COMPREHENSIVE METABOLIC PANEL
ALT: 38 U/L (ref 0–53)
AST: 24 U/L (ref 0–37)
Albumin: 4.3 g/dL (ref 3.5–5.2)
Alkaline Phosphatase: 60 U/L (ref 39–117)
BUN: 18 mg/dL (ref 6–23)
CHLORIDE: 107 meq/L (ref 96–112)
CO2: 27 meq/L (ref 19–32)
Calcium: 9.6 mg/dL (ref 8.4–10.5)
Creatinine, Ser: 1.05 mg/dL (ref 0.40–1.50)
GFR: 79.08 mL/min (ref >60.00)
GLUCOSE: 87 mg/dL (ref 70–99)
Potassium: 4 mEq/L (ref 3.5–5.1)
Sodium: 143 mEq/L (ref 135–145)
Total Bilirubin: 0.5 mg/dL (ref 0.2–1.2)
Total Protein: 6.8 g/dL (ref 6.0–8.3)

## 2018-05-26 LAB — HEMOGLOBIN A1C: Hgb A1c MFr Bld: 6.5 % (ref 4.6–6.5)

## 2018-06-30 ENCOUNTER — Encounter: Payer: Self-pay | Admitting: Medical

## 2018-07-21 ENCOUNTER — Encounter: Payer: Self-pay | Admitting: Medical

## 2018-07-22 ENCOUNTER — Telehealth: Payer: Self-pay | Admitting: Medical

## 2018-07-22 MED ORDER — CELECOXIB 200 MG PO CAPS: 200.0000 mg | ORAL_CAPSULE | Freq: Two times a day (BID) | ORAL | 0 refills | Status: DC

## 2018-07-22 NOTE — Telephone Encounter (Signed)
Rx celebrex sent to pt pharmacy.

## 2018-07-24 MED FILL — CELECOXIB 200 MG CAP: 200 | 30 days supply | Qty: 60 | Fill #0

## 2018-08-06 DIAGNOSIS — M47812 Spondylosis without myelopathy or radiculopathy, cervical region: Secondary | ICD-10-CM | POA: Diagnosis not present

## 2018-08-06 DIAGNOSIS — Z6841 Body Mass Index (BMI) 40.0 and over, adult: Secondary | ICD-10-CM | POA: Diagnosis not present

## 2018-08-06 DIAGNOSIS — I10 Essential (primary) hypertension: Secondary | ICD-10-CM | POA: Diagnosis not present

## 2018-09-07 ENCOUNTER — Telehealth: Payer: 59 | Admitting: Family

## 2018-09-07 ENCOUNTER — Encounter: Payer: Self-pay | Admitting: Medical

## 2018-09-07 DIAGNOSIS — L237 Allergic contact dermatitis due to plants, except food: Secondary | ICD-10-CM

## 2018-09-07 MED ORDER — TRIAMCINOLONE ACETONIDE 0.1 % EX CREA: 1.0000 "application " | TOPICAL_CREAM | Freq: Two times a day (BID) | CUTANEOUS | 0 refills | Status: DC

## 2018-09-07 NOTE — Progress Notes (Signed)
E Visit for Rash  We are sorry that you are not feeling well. Here is how we plan to help!  Based on what you shared with me it looks like you have contact dermatitis.  Contact dermatitis is a skin rash caused by something that touches the skin and causes irritation or inflammation.  Your skin may be red, swollen, dry, cracked, and itch.  The rash should go away in a few days but can last a few weeks.  If you get a rash, it's important to figure out what caused it so the irritant can be avoided in the future.  Triamcinolone cream has been sent to the pharmacy   HOME CARE:   Take cool showers and avoid direct sunlight.  Apply cool compress or wet dressings.  Take a bath in an oatmeal bath.  Sprinkle content of one Aveeno packet under running faucet with comfortably warm water.  Bathe for 15-20 minutes, 1-2 times daily.  Pat dry with a towel. Do not rub the rash.  Use hydrocortisone cream.  Take an antihistamine like Benadryl for widespread rashes that itch.  The adult dose of Benadryl is 25-50 mg by mouth 4 times daily.  Caution:  This type of medication may cause sleepiness.  Do not drink alcohol, drive, or operate dangerous machinery while taking antihistamines.  Do not take these medications if you have prostate enlargement.  Read package instructions thoroughly on all medications that you take.  GET HELP RIGHT AWAY IF:   Symptoms don't go away after treatment.  Severe itching that persists.  If you rash spreads or swells.  If you rash begins to smell.  If it blisters and opens or develops a yellow-brown crust.  You develop a fever.  You have a sore throat.  You become short of breath.  MAKE SURE YOU:  Understand these instructions. Will watch your condition. Will get help right away if you are not doing well or get worse.  Thank you for choosing an e-visit. Your e-visit answers were reviewed by a board certified advanced clinical practitioner to complete your  personal care plan. Depending upon the condition, your plan could have included both over the counter or prescription medications. Please review your pharmacy choice. Be sure that the pharmacy you have chosen is open so that you can pick up your prescription now.  If there is a problem you may message your provider in MyChart to have the prescription routed to another pharmacy. Your safety is important to [place]. If you have drug allergies check your prescription carefully.  For the next 24 hours, you can use MyChart to ask questions about today's visit, request a non-urgent call back, or ask for a work or school excuse from your e-visit provider. You will get an email in the next two days asking about your experience. I hope that your e-visit has been valuable and will speed your recovery.

## 2018-09-08 ENCOUNTER — Encounter: Payer: Self-pay | Admitting: Medical

## 2018-09-08 ENCOUNTER — Telehealth: Payer: Self-pay | Admitting: Medical

## 2018-09-08 ENCOUNTER — Ambulatory Visit (INDEPENDENT_AMBULATORY_CARE_PROVIDER_SITE_OTHER): Payer: 59 | Admitting: Medical

## 2018-09-08 ENCOUNTER — Telehealth: Payer: 59 | Admitting: Physician Assistant

## 2018-09-08 ENCOUNTER — Emergency Department (HOSPITAL_BASED_OUTPATIENT_CLINIC_OR_DEPARTMENT_OTHER)
Admission: EM | Admit: 2018-09-08 | Discharge: 2018-09-08 | Disposition: A | Discharge status: Home / Self Care | Payer: 59 | Attending: Emergency Medicine | Admitting: Emergency Medicine

## 2018-09-08 ENCOUNTER — Encounter (HOSPITAL_BASED_OUTPATIENT_CLINIC_OR_DEPARTMENT_OTHER): Payer: Self-pay | Admitting: *Deleted

## 2018-09-08 ENCOUNTER — Other Ambulatory Visit: Payer: Self-pay

## 2018-09-08 DIAGNOSIS — L089 Local infection of the skin and subcutaneous tissue, unspecified: Secondary | ICD-10-CM

## 2018-09-08 DIAGNOSIS — T7840XA Allergy, unspecified, initial encounter: Secondary | ICD-10-CM | POA: Diagnosis not present

## 2018-09-08 DIAGNOSIS — T63304A Toxic effect of unspecified spider venom, undetermined, initial encounter: Secondary | ICD-10-CM

## 2018-09-08 DIAGNOSIS — I10 Essential (primary) hypertension: Secondary | ICD-10-CM | POA: Insufficient documentation

## 2018-09-08 DIAGNOSIS — L237 Allergic contact dermatitis due to plants, except food: Secondary | ICD-10-CM | POA: Diagnosis not present

## 2018-09-08 DIAGNOSIS — L247 Irritant contact dermatitis due to plants, except food: Secondary | ICD-10-CM | POA: Diagnosis not present

## 2018-09-08 DIAGNOSIS — R21 Rash and other nonspecific skin eruption: Secondary | ICD-10-CM

## 2018-09-08 DIAGNOSIS — Z79899 Other long term (current) drug therapy: Secondary | ICD-10-CM | POA: Insufficient documentation

## 2018-09-08 MED ORDER — FAMOTIDINE 20 MG PO TABS: 20.0000 mg | ORAL_TABLET | Freq: Two times a day (BID) | ORAL | 0 refills | Status: DC

## 2018-09-08 MED ORDER — TRIAMCINOLONE ACETONIDE 0.1 % EX CREA: 1.0000 "application " | TOPICAL_CREAM | Freq: Four times a day (QID) | CUTANEOUS | 0 refills | Status: DC | PRN

## 2018-09-08 MED ORDER — DOXYCYCLINE HYCLATE 100 MG PO TABS: 100.0000 mg | ORAL_TABLET | Freq: Two times a day (BID) | ORAL | 0 refills | Status: DC

## 2018-09-08 MED FILL — DOXYCYCLINE HYCLATE 100 MG: 100 | 10 days supply | Qty: 20 | Fill #0

## 2018-09-08 NOTE — Patient Instructions (Addendum)
By appearance  it does appear that patient has probable allergic reaction to poison ivy but concern for spider bite and secondary skin infection.  Going to send in doxycycline antibiotic today.  Rx advised and given.  Asked him to start tablets this afternoon.  Decided to schedule him for a nurse visit tomorrow.  I think you would likely benefit from Depo-Medrol injection and Rocephin 1 g injection.  Notified Clydie Braun to get me before decision made on injections.  Had not decided yet if we will give him Depo-Medrol versus prednisone.  He reports history of allergy to prednisone but this was around the same time that he took Celebrex.  Going to touch base with pharmacy downstairs regarding patient's reported reaction to prednisone.  Seek pharmacist advisement.  Did explain to patient regarding that if he does have a blisters from a spider bite and if he starts to form ulcerations that we need to follow him closely and try to get him in to see a wound care specialist.  Presently with the viral pandemic situation access to specialists but should be available in emergency situations.  Follow-up tomorrow during the nurse visit.

## 2018-09-08 NOTE — ED Notes (Signed)
Pt noted scaly draining patch right forearm following yard work over the weekend.  Large fluid filled blisters visible. Pt reports itch.

## 2018-09-08 NOTE — Discharge Instructions (Addendum)
Start the doxycycline as directed. Add benadryl, 25 mg, every 6 hours. Follow-up with your provider as scheduled tomorrow.

## 2018-09-08 NOTE — Telephone Encounter (Signed)
See webex visit from 09/08/18.

## 2018-09-08 NOTE — ED Notes (Signed)
Discharged home with prescriptions and instructions for follow up.  Verbalizes understanding.  Unable to sign for discharge. Due to computer malfunction.  Walked to discharge desk.

## 2018-09-08 NOTE — Progress Notes (Signed)
Based on what you shared with me, I feel your condition warrants further evaluation and I recommend that you be seen for a face to face office visit. Your rash appears to have become infected.  I am concerned that without proper initial treatment you condition will worsen.       NOTE: If you entered your credit card information for this eVisit, you will not be charged. You may see a "hold" on your card for the $35 but that hold will drop off and you will not have a charge processed.  If you are having a true medical emergency please call 911.  If you need an urgent face to face visit, [place] has four urgent care centers for your convenience.    PLEASE NOTE: THE [facility] LOCATIONS AND URGENT CARE CLINICS DO NOT HAVE THE TESTING FOR CORONAVIRUS COVID19 AVAILABLE.  IF YOU FEEL YOU NEED THIS TEST YOU MUST GO TO A TRIAGE LOCATION AT ONE OF THE HOSPITAL EMERGENCY DEPARTMENTS ?  [email or web address] to reserve your spot online an avoid wait times  [facility] [address] [place], [place] [address] 8 am to 8 pm Monday-Friday 10 am to 4 pm Saturday-Sunday *Across the street from [employer]  [address] [place], [address] 8 am to 5 pm Monday-Friday * In the [facility] on the [facility]   The following sites will take your insurance:  [facility]  [phone] Get Driving Directions Find a Provider at this Location  [address] [place], [place] [address] 10 am to 8 pm Monday-Friday 12 pm to 8 pm [facility] at [facility]  [phone] Get Driving Directions Find a Provider at this Location  [address] [place], [place] [address] 8 am to 8 pm Monday-Friday 9 am to 6 pm Saturday 11 am to 6 pm Sunday   [facility] at [facility]  [phone] Get Driving Directions  [address] [place], [place] [address] 8 am to 8 pm Monday-Friday 8 am to 4 pm Saturday-Sunday   Your e-visit answers were reviewed by a board certified advanced clinical practitioner to complete your personal care plan.  Thank you for using e-Visits.  ===View-only below this line===   ----- Message -----    From: Nathan Brandt    Sent: 09/08/2018 11:11 AM EDT      To: E-Visit Mailing List Subject: E-Visit Submission: Poison Ivy  E-Visit Submission: Poison Ivy --------------------------------  Question: Which best describes the location of your rash? Answer:   On one extremity (hand, arm, foot or leg)            On my face and/or genital areas  Question: What best describes your rash? Answer:   A line of blisters that itch            A large area of red lines, streaks, and blisters that itch  Question: Were you recently outside? Answer:   Yes  Question: If YES, Were you possibly exposed to poison ivy, oak or sumac leaves? Answer:   Yes  Question: If NO, Do you have a pet that was out of doors? Answer:     Question: Has anyone in your family been out of doors and you have handled their clothes, shoes, equipment or towels? Answer:   Yes  Question: Have you been outside near anyone burning brush (yard debris) Answer:  No  Question: If YES, Are you abnormally wheezing or short of breath since that exposure? Answer:   No  Question: How long was it between the time you were exposed and the apppearance of the rash? Answer:   Less than a week  Question: How long has your rash been present? Answer:   Less than a week  Question: Have you used any over the counter medications for your rash? Answer:   Yes  Question: Which of the following OTC Medications have you used? Loraine Leriche all that apply) Answer:   Hydrocortisone ointment            An oral antihistamine such as Benadryl  Question: Have you developed a fever? Answer:   I am not sure  Question: Is the rash spreading? Answer:    Yes  Question: Have you scratched the rash and broken the skin or blisters? Answer:   Yes  Question: If YES, Has pus (milky purulent drainage) developed from or around the blisters? Answer:   Yes  Question: Can you tolerate oral prednisone? Answer:   Yes  Question: Do you have any other comments or concerns for your provider? Answer:   eyes have been swollen since.  Question: Are you able to attach a photograph of the rash? Answer:   Yes  Question: Please list your medication allergies that you may have ? (If 'none' , please list as 'none') Answer:   none  Question: Please list any additional comments  Answer:   it has gotten worse from yesterday to today pictures attached  A total of 5-10 minutes was spent evaluating this patients questionnaire and formulating a plan of care.

## 2018-09-08 NOTE — ED Triage Notes (Signed)
Pt c/o rash to right arm x 4 days ? poison ivy

## 2018-09-08 NOTE — Telephone Encounter (Signed)
Pt scheduled for today.  

## 2018-09-08 NOTE — Telephone Encounter (Signed)
This is the patient I want to do web ex for rash on body and around eyes.

## 2018-09-08 NOTE — Progress Notes (Addendum)
Subjective:    Patient ID: Nathan Brandt, male    DOB: 03-25-1966, 53 y.o.   MRN: [id number]  HPI  Virtual Visit via Video Note  I connected with Onalee Hua on 09/08/18 at  2:20 PM EDT by a video enabled telemedicine application and verified that I am speaking with the correct person using two identifiers.   I discussed the limitations of evaluation and management by telemedicine and the availability of in person appointments. The patient expressed understanding and agreed to proceed.    History of Present Illness:  Patient states that on Saturday he was working in his yard pulling vines adjacent to his house.  Then the next morning he noticed pinkish-red rash on both forearms extending up to his antecubital fossa/elbow regions.  This rash is have itched since Saturday.  Yesterday he did do E visit and on review that appears that he was given topical steroid for treatment.  However this morning when he woke up he had to moderate sized blisters on his forearm.  He shows me on the video that they have a purplish black appearance.  On review he speculates that he saw some spiders in the area where he was pulling vines.  He does also note history of allergies to poison ivy. He is not having any fevers chills or sweats.  The region of pinkish redness has not expanded up his arm since onset of the rash. He does report some warmth and palpation and mild tender.  On review of medications he notes that he did have a swollen uvula and some difficulty swallowing when he took Celebrex and prednisone at the same time in the past.  So it is unclear which she is allergic to.  He is on neither of those presently.  Observations/Objective: On video I can see that he has pinkish-red rash on both forearms and into the elbow/antecubital regions bilaterally.  The right side looks worse than the left.  I can see to dark appearing blisters on his forearm.  Estimation  of the size of these will be 1.5 cm   Assessment and Plan: By appearance it does appear that patient has probable allergic reaction to poison ivy but concern for spider bite and secondary skin infection.  Going to send in doxycycline antibiotic today.  Rx advised and given.  Asked him to start tablets this afternoon.  Decided to schedule him for a nurse visit tomorrow.  I think you would likely benefit from Depo-Medrol injection and Rocephin 1 g injection.  Notified Clydie Braun to get me before decision made on injections.  Had not decided yet if we will give him Depo-Medrol versus prednisone.  He reports history of allergy to prednisone but this was around the same time that he took Celebrex.  Going to touch base with pharmacy downstairs regarding patient's reported reaction to prednisone.  Seek pharmacist advisement.  Did explain to patient regarding that if he does have a blisters from a spider bite and if he starts to form ulcerations that we need to follow him closely and try to get him in to see a wound care specialist.  Presently with the viral pandemic situation access to specialists but should be available in emergency situations.  Follow-up tomorrow during the nurse visit.  Follow Up Instructions:    I discussed the assessment and treatment plan with the patient. The patient was provided an opportunity to ask questions and all were answered. The patient agreed with the plan and demonstrated an understanding  of the instructions.   The patient was advised to call back or seek an in-person evaluation if the symptoms worsen or if the condition fails to improve as anticipated.  25 minutes spent with patient.  50% of time spent counseling on approach going forward in light of his allergic reaction with possible spider bite and a secondary skin infection.   Esperanza Richters, PA-C   Review of Systems     Objective:   Physical Exam Na.       Assessment & Plan:

## 2018-09-08 NOTE — ED Provider Notes (Signed)
[facility] EMERGENCY DEPARTMENT Provider Note   CSN: [id number] Arrival date & time: 09/08/18  1714    History   Chief Complaint Chief Complaint  Patient presents with  . Rash    HPI Nathan Brandt is a 53 y.o. male.     Patient seen by E-visit today for rash assessment. Patient was doing yard work 4-5 days ago, clearing vines and other TEFL teacher. Developed pruritic, weeping, vesicular rash, predominantly on right forearm and upper arm, with minimal exposure to left upper arm. He was given prescription for doxycycline with concern for secondary infection. He has had issues with taking oral prednisone.   Rash  Location:  Shoulder/arm Shoulder/arm rash location:  R forearm and R upper arm Quality: blistering, itchiness and weeping   Severity:  Moderate Duration:  4 days Timing:  Constant Progression:  Worsening Chronicity:  New Context: plant contact   Ineffective treatments:  None tried Associated symptoms: no hoarse voice, no sore throat and no throat swelling     Past Medical History:  Diagnosis Date  . Hypertension     There are no active problems to display for this patient.   Past Surgical History:  Procedure Laterality Date  . KIDNEY SURGERY    . KIDNEY SURGERY     left side surgery. 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        Home Medications    Prior to Admission medications   Medication Sig Start Date End Date Taking? Authorizing Provider  celecoxib (CELEBREX) 200 MG capsule Take 1 capsule (200 mg total) by mouth 2 (two) times daily. 07/22/18   Saguier, Ramon Dredge, PA-C  doxycycline (VIBRA-TABS) 100 MG tablet Take 1 tablet (100 mg total) by mouth 2 (two) times daily. CAN GIVE CAPS OR GENERIC. 09/08/18   Saguier, Ramon Dredge, PA-C  losartan (COZAAR) 100 MG tablet Take 100 mg by mouth daily.    [provider]  triamcinolone cream (KENALOG) 0.1 % Apply 1 application topically 2 (two) times daily. 09/07/18   Eulis Foster,  FNP    Family History Family History  Problem Relation Age of Onset  . Cancer Mother   . Diabetes Father     Social History Social History   Tobacco Use  . Smoking status: Never Smoker  . Smokeless tobacco: Never Used  Substance Use Topics  . Alcohol use: Yes    Frequency: Never    Comment: 2 glasses of wine every 3 months.  . Drug use: Never     Allergies   Prednisone   Review of Systems Review of Systems  HENT: Negative for hoarse voice and sore throat.   Skin: Positive for rash.  All other systems reviewed and are negative.    Physical Exam Updated Vital Signs BP (!) 155/100 (BP Location: Left Arm)   Pulse 78   Temp 98.2 F (36.8 C) (Oral)   Resp 18   Ht 5\' 4"  (1.626 m)   Wt 106.1 kg   SpO2 99%   BMI 40.17 kg/m   Physical Exam Vitals signs and nursing note reviewed.  Constitutional:      General: He is not in acute distress.    Appearance: Normal appearance. He is not ill-appearing.  HENT:     Head: Normocephalic.  Eyes:     Conjunctiva/sclera: Conjunctivae normal.  Cardiovascular:     Rate and Rhythm: Normal rate.  Pulmonary:     Effort: Pulmonary effort is normal.  Musculoskeletal: Normal range of motion.  Skin:  General: Skin is warm and dry.     Findings: Rash present.  Neurological:     Mental Status: He is alert and oriented to person, place, and time.  Psychiatric:        Mood and Affect: Mood normal.        ED Treatments / Results  Labs (all labs ordered are listed, but only abnormal results are displayed) Labs Reviewed - No data to display  EKG None  Radiology No results found.  Procedures Procedures (including critical care time)  Medications Ordered in ED Medications - No data to display   Initial Impression / Assessment and Plan / ED Course  I have reviewed the triage vital signs and the nursing notes.  Pertinent labs & imaging results that were available during my care of the patient were reviewed by me  and considered in my medical decision making (see chart for details).           Patient with contact dermatitis. Instructed to avoid offending agent and to use unscented soaps, lotions, and detergents. Will treat with kenalog, pepcid, and benadryl.  Potential secondary infection, has script for doxycycline.. Follow up with PCP tomorrow as scheduled. Return precautions discussed. Pt is safe for discharge at this time.             Final Clinical Impressions(s) / ED Diagnoses   Final diagnoses:  Irritant contact dermatitis due to plants, except food    ED Discharge Orders         Ordered    famotidine (PEPCID) 20 MG tablet  2 times daily     09/08/18 1756    triamcinolone cream (KENALOG) 0.1 %  4 times daily PRN     09/08/18 1756           Felicie Morn, NP 09/08/18 1812    Little, Ambrose [place], MD 09/08/18 [phone]

## 2018-09-09 ENCOUNTER — Ambulatory Visit (INDEPENDENT_AMBULATORY_CARE_PROVIDER_SITE_OTHER): Payer: 59

## 2018-09-09 ENCOUNTER — Telehealth: Payer: Self-pay

## 2018-09-09 VITALS — BP 134/96 | HR 87

## 2018-09-09 DIAGNOSIS — R21 Rash and other nonspecific skin eruption: Secondary | ICD-10-CM

## 2018-09-09 MED ORDER — METHYLPREDNISOLONE 4 MG PO TABS: ORAL_TABLET | ORAL | 0 refills | Status: DC

## 2018-09-09 MED ORDER — METHYLPREDNISOLONE ACETATE 40 MG/ML IJ SUSP
40.0000 mg | Freq: Once | INTRAMUSCULAR | Status: AC
  Administered 2018-09-09: 40 mg via INTRAMUSCULAR

## 2018-09-09 NOTE — Telephone Encounter (Signed)
He went to ED last night. Is he going to UC this morning.?

## 2018-09-09 NOTE — Telephone Encounter (Signed)
According to the phone message he is still coming here to be seen.

## 2018-09-09 NOTE — Addendum Note (Signed)
Addended by: Gwenevere Abbot on: 09/09/2018 10:47 AM   Modules accepted: Orders

## 2018-09-09 NOTE — Progress Notes (Signed)
Pre visit review using our clinic tool,if applicable. No additional management support is needed unless otherwise documented below in the visit note.   Patient asked to come to office by E. Saguier,PAc-C.  Went to ED here last pm. Given medication.  BP= 134/96 P= 87  Per E. Saguier patient to have Depo 40 mg and dsg change.  Given 40 mg Depo Medrol IM right hip. Patient tolerated well. Chaged dressing to right forearm and had patient wait in waiting room for 20 minutes to monitor for reaction to injection.

## 2018-09-09 NOTE — Telephone Encounter (Signed)
Copied from CRM [id number]. Topic: General - Inquiry >> Sep 08, 2018  4:44 PM Terisa Starr wrote: Reason for CRM: patient said that he is going to urgent care for his rash because he said it is getting worse. He did not want me to cancel the appt with the nurse for in the morning at 9:4

## 2018-09-22 ENCOUNTER — Encounter: Payer: Self-pay | Admitting: Medical

## 2018-09-22 ENCOUNTER — Ambulatory Visit (INDEPENDENT_AMBULATORY_CARE_PROVIDER_SITE_OTHER): Payer: 59 | Admitting: Medical

## 2018-09-22 ENCOUNTER — Other Ambulatory Visit: Payer: Self-pay

## 2018-09-22 VITALS — BP 153/105

## 2018-09-22 DIAGNOSIS — I10 Essential (primary) hypertension: Secondary | ICD-10-CM

## 2018-09-22 DIAGNOSIS — T7840XD Allergy, unspecified, subsequent encounter: Secondary | ICD-10-CM | POA: Diagnosis not present

## 2018-09-22 MED ORDER — LOSARTAN POTASSIUM 100 MG PO TABS: 100.0000 mg | ORAL_TABLET | Freq: Every day | ORAL | 0 refills | Status: DC

## 2018-09-22 NOTE — Progress Notes (Signed)
   Subjective:    Patient ID: Nathan Brandt, male    DOB: 09/16/1965, 53 y.o.   MRN: [id number]  HPI  Virtual Visit via Video Note  I connected with Onalee Hua on 09/22/18 at  8:20 AM EDT by a video enabled telemedicine application and verified that I am speaking with the correct person using two identifiers.   I discussed the limitations of evaluation and management by telemedicine and the availability of in person appointments. The patient expressed understanding and agreed to proceed.   History of Present Illness: Pt 1st bp read was 153/105. 2nd reading was better at. Pt lost 12 lbs and stopped his losartan.   Pt rt forearm rash is about 90% better. Got better with steroid injection and medrol dose pack. Ran out of meds yesterday and states seeing incremental improvement. No fever, no chills or sweats. Now rash area is just dry and flaking. No itching or warmth.    Observations/Objective: No acute distress.  Skin- rt forearm. Looks much better rash look very faint. Dry flaky appearance. No honey crusting on inspsection. No blisters.   Assessment and Plan: Htn not well controlled but off meds and has gained wait. Asked restart losartan and send rx in. Exercise and low salt diet. My chart update on bp readings in 10-14 days.  Allergic reaction with secondary infection appears like it has resolved by inspection and pt description. No further tx needed.  Follow up by my chart 10-14 days.  Follow Up Instructions:    I discussed the assessment and treatment plan with the patient. The patient was provided an opportunity to ask questions and all were answered. The patient agreed with the plan and demonstrated an understanding of the instructions.   The patient was advised to call back or seek an in-person evaluation if the symptoms worsen or if the condition fails to improve as anticipated.  I provided 25  minutes of non-face-to-face time during this encounter.   Esperanza Richters, PA-C   Review of Systems  Constitutional: Negative for chills, fatigue and fever.  Respiratory: Negative for cough, chest tightness, shortness of breath and wheezing.   Cardiovascular: Negative for chest pain and palpitations.  Gastrointestinal: Negative for abdominal distention, abdominal pain, constipation, rectal pain and vomiting.  Genitourinary: Negative for difficulty urinating, discharge, dysuria, flank pain, hematuria, penile swelling and testicular pain.  Musculoskeletal: Negative for back pain, myalgias and neck stiffness.  Skin: Negative for wound.  Neurological: Negative for dizziness, speech difficulty, weakness, numbness and headaches.  Hematological: Negative for adenopathy. Does not bruise/bleed easily.  Psychiatric/Behavioral: Negative for behavioral problems, confusion, hallucinations and suicidal ideas. The patient is not nervous/anxious.        Objective:   Physical Exam No acute distress.       Assessment & Plan:

## 2018-09-22 NOTE — Patient Instructions (Signed)
Htn not well controlled but off meds and has gained wait. Asked restart losartan and send rx in. Exercise and low salt diet. My chart update on bp readings in 10-14 days.  Allergic reaction with secondary infection appears like it has resolved by inspection and pt description. No further tx needed.  Follow up by my chart 10-14 days.

## 2018-09-25 ENCOUNTER — Other Ambulatory Visit: Payer: Self-pay

## 2018-09-25 ENCOUNTER — Other Ambulatory Visit: Payer: Self-pay | Admitting: Medical

## 2018-09-25 MED ORDER — LOSARTAN POTASSIUM 100 MG PO TABS: 100.0000 mg | ORAL_TABLET | Freq: Every day | ORAL | 0 refills | Status: DC

## 2018-09-25 MED FILL — LOSARTAN POTASSIUM 100 MG T: 100 | 30 days supply | Qty: 30 | Fill #0

## 2018-10-01 ENCOUNTER — Encounter: Payer: Self-pay | Admitting: Medical

## 2018-10-06 NOTE — Progress Notes (Signed)
Noted. Agree with above.  Jilda Roche [place], DO 10/06/18 4:07 PM

## 2018-10-08 ENCOUNTER — Other Ambulatory Visit: Payer: Self-pay

## 2018-10-08 ENCOUNTER — Ambulatory Visit: Payer: 59 | Admitting: Medical

## 2018-10-08 ENCOUNTER — Telehealth: Payer: Self-pay | Admitting: Medical

## 2018-10-08 NOTE — Progress Notes (Signed)
No show today. Will try to reschedule.

## 2018-10-08 NOTE — Telephone Encounter (Signed)
Called pt yesterday to have virtual visit that was scheduled.. Want to see if his bp controlled. Can he reschedule for next week.

## 2018-10-12 NOTE — Telephone Encounter (Signed)
Left pt a message to call back and schedule vov.  

## 2018-10-13 ENCOUNTER — Encounter: Payer: Self-pay | Admitting: Medical

## 2018-10-14 ENCOUNTER — Other Ambulatory Visit: Payer: Self-pay

## 2018-10-14 ENCOUNTER — Encounter: Payer: Self-pay | Admitting: Medical

## 2018-10-14 ENCOUNTER — Ambulatory Visit (INDEPENDENT_AMBULATORY_CARE_PROVIDER_SITE_OTHER): Payer: 59 | Admitting: Medical

## 2018-10-14 VITALS — BP 144/80 | HR 82

## 2018-10-14 DIAGNOSIS — I10 Essential (primary) hypertension: Secondary | ICD-10-CM

## 2018-10-14 DIAGNOSIS — M25511 Pain in right shoulder: Secondary | ICD-10-CM

## 2018-10-14 DIAGNOSIS — G8929 Other chronic pain: Secondary | ICD-10-CM | POA: Diagnosis not present

## 2018-10-14 MED ORDER — HYDROCHLOROTHIAZIDE 12.5 MG PO CAPS: 12.5000 mg | ORAL_CAPSULE | Freq: Every day | ORAL | 3 refills | Status: DC

## 2018-10-14 MED ORDER — CELECOXIB 200 MG PO CAPS: 200.0000 mg | ORAL_CAPSULE | Freq: Every day | ORAL | 1 refills | Status: DC

## 2018-10-14 MED FILL — HYDROCHLOROTHIAZIDE 12.5 MG: 12.5 | 90 days supply | Qty: 90 | Fill #0

## 2018-10-14 MED FILL — CELECOXIB 200 MG CAP: 200 | 30 days supply | Qty: 30 | Fill #0

## 2018-10-14 NOTE — Patient Instructions (Signed)
Htn/bp better than recent visit on losartan. But not ideal. Do think best to add low dose hctz to decrease bp. Advised better diet and get daily exercise.  For rt shoulder pain and rt trapezius pain refilled his celebrex. Hx of osteoarthritis type finding by xray. Explain just use as needed and not required daily as can increase bp.   Follow up in mid to late august for visit. Repeat cmp, lipid panel and a1c at that time.

## 2018-10-14 NOTE — Progress Notes (Signed)
   Subjective:    Patient ID: Nathan Brandt, male    DOB: 11/26/1965, 53 y.o.   MRN: [id number]  HPI  Virtual Visit via Video Note  I connected with Nathan Brandt on 10/14/18 at 10:00 AM EDT by a video enabled telemedicine application and verified that I am speaking with the correct person using two identifiers.  Location: Patient: home Provider: home   I discussed the limitations of evaluation and management by telemedicine and the availability of in person appointments. The patient expressed understanding and agreed to proceed.  History of Present Illness: Pt states that his bp is better with medications. No cardiac or neurologic signs or symptoms. Pt going to start exercising and eating better.  Pt also has some rt trapezius and rt posterior shoulder pain. Pt uses celebrex in the past and it helps. Last night he tried advil since he was out of celebrex. Pt had xray with of shoulder in past. Showed some osteoarthritis.     Observations/Objective: General- no acute distress. Lungs- even unlabored. Neuro- appears to have good motor function. Rt shoulder good rom. Points more to rt trapezius as source of pain.  Assessment and Plan: Htn/bp better than recent visit on losartan. But not ideal. Do think best to add low dose hctz to decrease bp. Advised better diet and get daily exercise.  For rt shoulder pain and rt trapezius pain refilled his celebrex. Hx of osteoarthritis type finding by xray. Explain just use as needed and not required daily as can increase bp.   Follow up in mid to late august for visit. Repeat cmp, lipid panel and a1c at that time  Follow Up Instructions:    I discussed the assessment and treatment plan with the patient. The patient was provided an opportunity to ask questions and all were answered. The patient agreed with the plan and demonstrated an understanding of the instructions.   The patient was advised to call back or seek an in-person evaluation  if the symptoms worsen or if the condition fails to improve as anticipated.     Esperanza Richters, PA-C    Review of Systems  Constitutional: Negative for activity change, chills, diaphoresis, fatigue and fever.  Respiratory: Negative for cough, chest tightness and shortness of breath.   Cardiovascular: Negative for chest pain, palpitations and leg swelling.  Gastrointestinal: Negative for abdominal pain, nausea and vomiting.  Musculoskeletal: Negative for neck pain and neck stiffness.       See hpi shoulder pain.  Neurological: Negative for dizziness, weakness and numbness.  Psychiatric/Behavioral: Negative for agitation, behavioral problems and confusion. The patient is not nervous/anxious.        Objective:   Physical Exam        Assessment & Plan:

## 2018-10-26 MED FILL — LOSARTAN POTASSIUM 100 MG T: 100 | 90 days supply | Qty: 90 | Fill #0

## 2018-11-13 MED FILL — CELECOXIB 200 MG CAP: 200 | 30 days supply | Qty: 30 | Fill #1

## 2018-12-15 ENCOUNTER — Other Ambulatory Visit: Payer: Self-pay | Admitting: Medical

## 2018-12-15 MED FILL — CELECOXIB 200 MG CAP: 200 | 30 days supply | Qty: 30 | Fill #0

## 2018-12-20 ENCOUNTER — Emergency Department (HOSPITAL_BASED_OUTPATIENT_CLINIC_OR_DEPARTMENT_OTHER)
Admission: EM | Admit: 2018-12-20 | Discharge: 2018-12-20 | Disposition: A | Discharge status: Home / Self Care | Payer: 59 | Attending: Emergency Medicine | Admitting: Emergency Medicine

## 2018-12-20 ENCOUNTER — Other Ambulatory Visit: Payer: Self-pay

## 2018-12-20 ENCOUNTER — Encounter (HOSPITAL_BASED_OUTPATIENT_CLINIC_OR_DEPARTMENT_OTHER): Payer: Self-pay | Admitting: Emergency Medicine

## 2018-12-20 DIAGNOSIS — Y999 Unspecified external cause status: Secondary | ICD-10-CM | POA: Diagnosis not present

## 2018-12-20 DIAGNOSIS — Y92016 Swimming-pool in single-family (private) house or garden as the place of occurrence of the external cause: Secondary | ICD-10-CM | POA: Diagnosis not present

## 2018-12-20 DIAGNOSIS — I10 Essential (primary) hypertension: Secondary | ICD-10-CM | POA: Insufficient documentation

## 2018-12-20 DIAGNOSIS — X509XXA Other and unspecified overexertion or strenuous movements or postures, initial encounter: Secondary | ICD-10-CM | POA: Insufficient documentation

## 2018-12-20 DIAGNOSIS — Y9389 Activity, other specified: Secondary | ICD-10-CM | POA: Insufficient documentation

## 2018-12-20 DIAGNOSIS — Z79899 Other long term (current) drug therapy: Secondary | ICD-10-CM | POA: Insufficient documentation

## 2018-12-20 DIAGNOSIS — M25512 Pain in left shoulder: Secondary | ICD-10-CM | POA: Diagnosis not present

## 2018-12-20 DIAGNOSIS — S4992XA Unspecified injury of left shoulder and upper arm, initial encounter: Secondary | ICD-10-CM | POA: Diagnosis present

## 2018-12-20 MED ORDER — KETOROLAC TROMETHAMINE 60 MG/2ML IM SOLN
30.0000 mg | Freq: Once | INTRAMUSCULAR | Status: AC
  Administered 2018-12-20: 30 mg via INTRAMUSCULAR
  Filled 2018-12-20: qty 2

## 2018-12-20 NOTE — ED Notes (Signed)
Pt understood dc material. NAD noted. All questions answered to satisfaction. Pt escorted to check out window 

## 2018-12-20 NOTE — ED Triage Notes (Signed)
Pt reports L shoulder pain since Friday, states he cleaned the pool that morning and may have injuried it then. CMS intact, unable to abduct/adduct well. Advil PM last night at 2300 not effective.

## 2018-12-20 NOTE — ED Provider Notes (Signed)
[facility] EMERGENCY DEPARTMENT Provider Note  CSN: [id number] Arrival date & time: 12/20/18 [id number]  Chief Complaint(s) Shoulder Injury  HPI Nathan Brandt is a 53 y.o. male past medical history listed below who presents to the emergency department with 2 days of left shoulder pain.  He reports that he was cleaning his pool with the skimmer and believes that might of caused the injury.  He reports that he was able to finish cleaning the pool that day however that night he noted mild pain that continued to worsen throughout the night and yesterday.  Pain aching and severe.  Exacerbated with movement and palpation.  Patient has decreased movement of the left shoulder because of pain.  Denies any falls or trauma.  No numbness or tingling.  No weakness distally.  Denies any other physical complaints.  HPI  Past Medical History Past Medical History:  Diagnosis Date   Hypertension    There are no active problems to display for this patient.  Home Medication(s) Prior to Admission medications   Medication Sig Start Date End Date Taking? Authorizing Provider  celecoxib (CELEBREX) 200 MG capsule TAKE 1 CAPSULE (200 MG TOTAL) BY MOUTH DAILY. 12/15/18  Yes Saguier, Ramon DredgeEdward, PA-C  losartan (COZAAR) 100 MG tablet TAKE 1 TABLET (100 MG TOTAL) BY MOUTH DAILY. 09/28/18  Yes Saguier, Ramon DredgeEdward, PA-C  doxycycline (VIBRA-TABS) 100 MG tablet Take 1 tablet (100 mg total) by mouth 2 (two) times daily. CAN GIVE CAPS OR GENERIC. 09/08/18   Saguier, Ramon DredgeEdward, PA-C  famotidine (PEPCID) 20 MG tablet Take 1 tablet (20 mg total) by mouth 2 (two) times daily. 09/08/18   Felicie MornSmith, David, NP  hydrochlorothiazide (MICROZIDE) 12.5 MG capsule Take 1 capsule (12.5 mg total) by mouth daily. 10/14/18   Saguier, Ramon DredgeEdward, PA-C  losartan (COZAAR) 100 MG tablet Take 1 tablet (100 mg total) by mouth daily. 09/25/18   Saguier, Ramon DredgeEdward, PA-C  methylPREDNISolone (MEDROL) 4 MG tablet 5 tab po day 1, 4 tab po day 2, 3 tab po day 3, 2 tab po  day 4, 1 tab po day 5 09/09/18   Saguier, Ramon DredgeEdward, PA-C  triamcinolone cream (KENALOG) 0.1 % Apply 1 application topically 2 (two) times daily. 09/07/18   Worthy RancherWebb, Padonda B, FNP  triamcinolone cream (KENALOG) 0.1 % Apply 1 application topically 4 (four) times daily as needed. 09/08/18   Felicie MornSmith, David, NP                                                                                                                                    Past Surgical History Past Surgical History:  Procedure Laterality Date   KIDNEY SURGERY     KIDNEY SURGERY     left side surgery. 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   Family History Family History  Problem Relation Age of Onset   Cancer Mother    Diabetes Father     Social History Social History  Tobacco Use   Smoking status: Never Smoker   Smokeless tobacco: Never Used  Substance Use Topics   Alcohol use: Yes    Frequency: Never    Comment: 2 glasses of wine every 3 months.   Drug use: Never   Allergies Prednisone  Review of Systems Review of Systems All other systems are reviewed and are negative for acute change except as noted in the HPI  Physical Exam Vital Signs  I have reviewed the triage vital signs BP 130/88 (BP Location: Right Arm)    Pulse 72    Temp 98.3 F (36.8 C) (Oral)    Resp 18    Ht 5\' 4"  (1.626 m)    Wt 106.6 kg    SpO2 97%    BMI 40.34 kg/m   Physical Exam Vitals signs reviewed.  Constitutional:      General: He is not in acute distress.    Appearance: He is well-developed. He is not diaphoretic.  HENT:     Head: Normocephalic and atraumatic.     Jaw: No trismus.     Right Ear: External ear normal.     Left Ear: External ear normal.     Nose: Nose normal.  Eyes:     General: No scleral icterus.    Conjunctiva/sclera: Conjunctivae normal.  Neck:     Musculoskeletal: Normal range of motion.     Trachea: Phonation normal.  Cardiovascular:     Rate and Rhythm: Normal rate and regular rhythm.    Pulmonary:     Effort: Pulmonary effort is normal. No respiratory distress.     Breath sounds: No stridor.  Abdominal:     General: There is no distension.  Musculoskeletal:     Left shoulder: He exhibits decreased range of motion, tenderness and spasm. He exhibits no bony tenderness, no swelling, no effusion, no deformity, normal pulse and normal strength.       Arms:  Neurological:     Mental Status: He is alert and oriented to person, place, and time.  Psychiatric:        Behavior: Behavior normal.     ED Results and Treatments Labs (all labs ordered are listed, but only abnormal results are displayed) Labs Reviewed - No data to display                                                                                                                       EKG  EKG Interpretation  Date/Time:    Ventricular Rate:    PR Interval:    QRS Duration:   QT Interval:    QTC Calculation:   R Axis:     Text Interpretation:        Radiology No results found.  Pertinent labs & imaging results that were available during my care of the patient were reviewed by me and considered in my medical decision making (see chart for details).  Medications Ordered in ED Medications  ketorolac (TORADOL) injection 30 mg (  has no administration in time range)                                                                                                                                    Procedures Procedures  (including critical care time)  Medical Decision Making / ED Course I have reviewed the nursing notes for this encounter and the patient's prior records (if available in EHR or on provided paperwork).   Nathan Huaorman Alms was evaluated in Emergency Department on 12/20/2018 for the symptoms described in the history of present illness. He was evaluated in the context of the global COVID-19 pandemic, which necessitated consideration that the patient might be at risk for infection with the  SARS-CoV-2 virus that causes COVID-19. Institutional protocols and algorithms that pertain to the evaluation of patients at risk for COVID-19 are in a state of rapid change based on information released by regulatory bodies including the CDC and federal and state organizations. These policies and algorithms were followed during the patient's care in the ED.  Left shoulder pain without acute trauma.  Muscle spasm versus tendinitis versus adhesive capsulitis versus arthritis exacerbation.  Patient's afebrile with stable vital signs.  Doubt septic arthritis.  No need for imaging or additional work-up at this time.  Provided with Toradol and sling for comfort.  Recommended supportive management and close PCP follow-up.      Final Clinical Impression(s) / ED Diagnoses Final diagnoses:  Acute pain of left shoulder     The patient appears reasonably screened and/or stabilized for discharge and I doubt any other medical condition or other [facility] requiring further screening, evaluation, or treatment in the ED at this time prior to discharge.  Disposition: Discharge  Condition: Good  I have discussed the results, Dx and Tx plan with the patient who expressed understanding and agree(s) with the plan. Discharge instructions discussed at great length. The patient was given strict return precautions who verbalized understanding of the instructions. No further questions at time of discharge.    ED Discharge Orders    None        Follow Up: Marisue BrooklynSaguier, Edward, PA-C [address] [place] [id number] [phone]  Schedule an appointment as soon as possible for a visit  If symptoms do not improve or  worsen     This chart was dictated using voice recognition software.  Despite best efforts to proofread,  errors can occur which can change the documentation meaning.   Nira Connardama, Mela Perham Eduardo, MD 12/20/18 0530

## 2018-12-21 ENCOUNTER — Other Ambulatory Visit: Payer: Self-pay

## 2018-12-22 ENCOUNTER — Ambulatory Visit (INDEPENDENT_AMBULATORY_CARE_PROVIDER_SITE_OTHER): Payer: 59 | Admitting: Medical

## 2018-12-22 ENCOUNTER — Ambulatory Visit (HOSPITAL_BASED_OUTPATIENT_CLINIC_OR_DEPARTMENT_OTHER)
Admission: RE | Admit: 2018-12-22 | Discharge: 2018-12-22 | Disposition: A | Discharge status: Home / Self Care | Payer: 59 | Source: Ambulatory Visit | Attending: Medical | Admitting: Medical

## 2018-12-22 ENCOUNTER — Encounter: Payer: Self-pay | Admitting: Medical

## 2018-12-22 VITALS — BP 155/88 | HR 87 | Temp 98.4°F | Resp 16 | Ht 64.0 in | Wt 244.5 lb

## 2018-12-22 DIAGNOSIS — M19012 Primary osteoarthritis, left shoulder: Secondary | ICD-10-CM | POA: Diagnosis not present

## 2018-12-22 DIAGNOSIS — M25512 Pain in left shoulder: Secondary | ICD-10-CM | POA: Diagnosis not present

## 2018-12-22 MED ORDER — HYDROCODONE-ACETAMINOPHEN 5-325 MG PO TABS: 1.0000 | ORAL_TABLET | Freq: Four times a day (QID) | ORAL | 0 refills | Status: DC | PRN

## 2018-12-22 MED FILL — HYDROCODON-APAP 5-325: 5-325 | 4 days supply | Qty: 16 | Fill #0

## 2018-12-22 NOTE — Progress Notes (Signed)
Subjective:    Patient ID: Nathan Brandt, male    DOB: 19-Oct-1965, 53 y.o.   MRN: [id number]  HPI  Pt in states recent shoulder pain. He was cleaning pool with long pole.Describes stirring pools/mixing chemical. This was on Friday and my night his shoulder pain was throbbing. Pain on movement. He went to the ED and evaluated.   Muscle spasm versus tendinitis versus adhesive capsulitis versus arthritis exacerbation. No xray done. No associated chest pain. Pt is wearing sling. He still has pain even at rest. Pt states toradol did not helps. Pt tried tylenol, motrin and alleve and really does not help with pain.  Pt was already on celebrex.  Review of Systems  Constitutional: Negative for chills, fatigue and fever.  Respiratory: Negative for cough, chest tightness, shortness of breath and wheezing.   Cardiovascular: Negative for chest pain and palpitations.  Gastrointestinal: Negative for abdominal pain, constipation and nausea.  Musculoskeletal: Negative for back pain, joint swelling, neck pain and neck stiffness.       Left shoulder pain  Skin: Negative for rash.  Neurological: Negative for dizziness, speech difficulty, weakness, numbness and headaches.  Hematological: Negative for adenopathy.  Psychiatric/Behavioral: Negative for behavioral problems, decreased concentration and suicidal ideas. The patient is not nervous/anxious.     Past Medical History:  Diagnosis Date  . Hypertension      Social History   Socioeconomic History  . Marital status: Married    Spouse name: Not on file  . Number of children: Not on file  . Years of education: Not on file  . Highest education level: Not on file  Occupational History  . Not on file  Social Needs  . Financial resource strain: Not on file  . Food insecurity    Worry: Not on file    Inability: Not on file  . Transportation needs    Medical: Not on file    Non-medical: Not on file  Tobacco Use  . Smoking status: Never Smoker   . Smokeless tobacco: Never Used  Substance and Sexual Activity  . Alcohol use: Yes    Frequency: Never    Comment: 2 glasses of wine every 3 months.  . Drug use: Never  . Sexual activity: Yes  Lifestyle  . Physical activity    Days per week: Not on file    Minutes per session: Not on file  . Stress: Not on file  Relationships  . Social Musicianconnections    Talks on phone: Not on file    Gets together: Not on file    Attends religious service: Not on file    Active member of club or organization: Not on file    Attends meetings of clubs or organizations: Not on file    Relationship status: Not on file  . Intimate partner violence    Fear of current or ex partner: Not on file    Emotionally abused: Not on file    Physically abused: Not on file    Forced sexual activity: Not on file  Other Topics Concern  . Not on file  Social History Narrative  . Not on file    Past Surgical History:  Procedure Laterality Date  . KIDNEY SURGERY    . KIDNEY SURGERY     left side surgery. 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    Family History  Problem Relation Age of Onset  . Cancer Mother   . Diabetes Father     Allergies  Allergen  Reactions  . Prednisone Anaphylaxis    uvula swollen    Current Outpatient Medications on File Prior to Visit  Medication Sig Dispense Refill  . celecoxib (CELEBREX) 200 MG capsule TAKE 1 CAPSULE (200 MG TOTAL) BY MOUTH DAILY. 30 capsule 1  . doxycycline (VIBRA-TABS) 100 MG tablet Take 1 tablet (100 mg total) by mouth 2 (two) times daily. CAN GIVE CAPS OR GENERIC. 20 tablet 0  . famotidine (PEPCID) 20 MG tablet Take 1 tablet (20 mg total) by mouth 2 (two) times daily. 20 tablet 0  . hydrochlorothiazide (MICROZIDE) 12.5 MG capsule Take 1 capsule (12.5 mg total) by mouth daily. 30 capsule 3  . losartan (COZAAR) 100 MG tablet TAKE 1 TABLET (100 MG TOTAL) BY MOUTH DAILY. 30 tablet 3  . losartan (COZAAR) 100 MG tablet Take 1 tablet (100 mg total) by  mouth daily. 30 tablet 0  . methylPREDNISolone (MEDROL) 4 MG tablet 5 tab po day 1, 4 tab po day 2, 3 tab po day 3, 2 tab po day 4, 1 tab po day 5 15 tablet 0  . triamcinolone cream (KENALOG) 0.1 % Apply 1 application topically 2 (two) times daily. 30 g 0  . triamcinolone cream (KENALOG) 0.1 % Apply 1 application topically 4 (four) times daily as needed. 30 g 0   No current facility-administered medications on file prior to visit.     BP (!) 169/98   Pulse 87   Temp 98.4 F (36.9 C) (Oral)   Resp 16   Ht 5\' 4"  (1.626 m)   Wt 244 lb 8 oz (110.9 kg)   SpO2 97%   BMI 41.97 kg/m       Objective:   Physical Exam  General Mental Status- Alert. General Appearance- Not in acute distress.   Skin General: Color- Normal Color. Moisture- Normal Moisture.  Neck Carotid Arteries- Normal color. Moisture- Normal Moisture. No carotid bruits. No JVD.  Chest and Lung Exam Auscultation: Breath Sounds:-Normal.  Cardiovascular Auscultation:Rythm- Regular. Murmurs & Other Heart Sounds:Auscultation of the heart reveals- No Murmurs.  Abdomen Inspection:-Inspeection Normal. Palpation/Percussion:Note:No mass. Palpation and Percussion of the abdomen reveal- Non Tender, Non Distended + BS, no rebound or guarding.   Neurologic Cranial Nerve exam:- CN III-XII intact(No nystagmus), symmetric smile. Strength:- 5/5 equal and symmetric strength both upper and lower extremities.  Left shoulder- Pt can't abduct left arm at all due to pain. Mild pain on palpation posterior aspect.    Assessment & Plan:  For your left shoulder pain will get xray today and refer to sports med MD. Continue celebrex and add norco today. Rx advisement given. Concern possible rotator cuff injury.  BP mild high today but in severe close to 10/10 pain recently so this may be effecting blood pressure.  Check bp later when relaxed and less pain. Update me on my chart later today or tomorrow on bp readings.  Follow up date  to be determined after sports med evaluation.  Mackie Pai, PA-C

## 2018-12-22 NOTE — Patient Instructions (Addendum)
For your left shoulder pain will get xray today and refer to sports med MD. Continue celebrex and add norco today. Rx advisement given. Concern possible rotator cuff injury.  BP mild high today but in severe close to 10/10 pain recently so this may be effecting blood pressure.  Check bp later when relaxed and less pain. Update me on my chart later today or tomorrow on bp readings.  Follow up date to be determined after sports med evaluation.

## 2018-12-23 ENCOUNTER — Encounter: Payer: Self-pay | Admitting: Family Medicine

## 2018-12-23 ENCOUNTER — Other Ambulatory Visit: Payer: Self-pay

## 2018-12-23 ENCOUNTER — Ambulatory Visit: Payer: Self-pay

## 2018-12-23 ENCOUNTER — Ambulatory Visit (INDEPENDENT_AMBULATORY_CARE_PROVIDER_SITE_OTHER): Payer: 59 | Admitting: Family Medicine

## 2018-12-23 ENCOUNTER — Telehealth: Payer: Self-pay | Admitting: Family Medicine

## 2018-12-23 VITALS — BP 160/119 | HR 87 | Ht 64.0 in | Wt 238.0 lb

## 2018-12-23 DIAGNOSIS — M7502 Adhesive capsulitis of left shoulder: Secondary | ICD-10-CM

## 2018-12-23 DIAGNOSIS — M25512 Pain in left shoulder: Secondary | ICD-10-CM

## 2018-12-23 DIAGNOSIS — M19019 Primary osteoarthritis, unspecified shoulder: Secondary | ICD-10-CM | POA: Insufficient documentation

## 2018-12-23 HISTORY — DX: Adhesive capsulitis of left shoulder: M75.02

## 2018-12-23 MED ORDER — TRIAMCINOLONE ACETONIDE 40 MG/ML IJ SUSP
40.0000 mg | Freq: Once | INTRAMUSCULAR | Status: AC
  Administered 2018-12-23: 40 mg via INTRA_ARTICULAR

## 2018-12-23 NOTE — Assessment & Plan Note (Addendum)
Significant loss of his ROM.  Has an effusion in the posterior glenohumeral joint.  May have a component of degenerative changes or a labral issue. -Glenohumeral injection  - counseled on HEp and supportive care - if no improvement would consider PT.

## 2018-12-23 NOTE — Telephone Encounter (Signed)
Left VM for patient. If he calls back please have him speak with a nurse/CMA and ask if he has an allergy to Kenalog.  We were flagged that he was anaphylactic to prednisone.  He will need to monitor her symptoms.  If he notices any shortness of breath that he will need to be seen in the emergency department.   If any questions then please take the best time and phone number to call and I will try to call him back.   Rosemarie Ax, MD [facility] 12/23/2018, 9:51 AM

## 2018-12-23 NOTE — Patient Instructions (Signed)
Nice to meet you Please try the exercises  Please try heat before the exercises and ice aftewards   Please send me a message in MyChart with any questions or updates.  Please see me back in 4 weeks.   --Dr. Raeford Razor

## 2018-12-23 NOTE — Progress Notes (Signed)
Nathan Brandt - 53 y.o. male MRN [id number]  Date of birth: 28-Oct-1965  SUBJECTIVE:  Including CC & ROS.  Chief Complaint  Patient presents with  . Shoulder Pain    left shoulder x 12/18/2018    Nathan Brandt is a 53 y.o. male that is  Presenting with left shoulder pain. The pain started after he was cleaning out his pool. Denies a trauma or inciting event. Pain is severe and constant. He is unable to move his shoulder in abduction. No improvement with medicine or modalities to date. Pain is localized to the shoulder. Pain is anterior.   He was seen in the emergency department on 7/12.  Independent review of the left shoulder x-ray from 7/14 shows degenerative changes of the glenohumeral joint.   Review of Systems  Constitutional: Negative for fever.  HENT: Negative for congestion.   Respiratory: Negative for cough.   Cardiovascular: Negative for chest pain.  Gastrointestinal: Negative for abdominal distention.  Musculoskeletal: Positive for arthralgias.  Skin: Negative for color change.  Neurological: Negative for weakness.  Hematological: Negative for adenopathy.    HISTORY: Past Medical, Surgical, Social, and Family History Reviewed & Updated per EMR.   Pertinent Historical Findings include:  Past Medical History:  Diagnosis Date  . 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 Hypertension     Past Surgical History:  Procedure Laterality Date  . KIDNEY SURGERY    . KIDNEY SURGERY     left side surgery. 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    Allergies  Allergen Reactions  . Prednisone Anaphylaxis    uvula swollen    Family History  Problem Relation Age of Onset  . Cancer Mother   . Diabetes Father      Social History   Socioeconomic History  . Marital status: Married    Spouse name: Not on file  . Number of children: Not on file  . Years of education: Not on file  . Highest education level: Not on file  Occupational History  .  Not on file  Social Needs  . Financial resource strain: Not on file  . Food insecurity    Worry: Not on file    Inability: Not on file  . Transportation needs    Medical: Not on file    Non-medical: Not on file  Tobacco Use  . Smoking status: Never Smoker  . Smokeless tobacco: Never Used  Substance and Sexual Activity  . Alcohol use: Yes    Frequency: Never    Comment: 2 glasses of wine every 3 months.  . Drug use: Never  . Sexual activity: Yes  Lifestyle  . Physical activity    Days per week: Not on file    Minutes per session: Not on file  . Stress: Not on file  Relationships  . Social Herbalist on phone: Not on file    Gets together: Not on file    Attends religious service: Not on file    Active member of club or organization: Not on file    Attends meetings of clubs or organizations: Not on file    Relationship status: Not on file  . Intimate partner violence    Fear of current or ex partner: Not on file    Emotionally abused: Not on file    Physically abused: Not on file    Forced sexual activity: Not on file  Other Topics Concern  . Not on file  Social History Narrative  .  Not on file     PHYSICAL EXAM:  VS: BP (!) 160/119   Pulse 87   Ht 5\' 4"  (1.626 m)   Wt 238 lb (108 kg)   BMI 40.85 kg/m  Physical Exam Gen: NAD, alert, cooperative with exam, well-appearing ENT: normal lips, normal nasal mucosa,  Eye: normal EOM, normal conjunctiva and lids CV:  no edema, +2 pedal pulses   Resp: no accessory muscle use, non-labored,  Skin: no rashes, no areas of induration  Neuro: normal tone, normal sensation to touch Psych:  normal insight, alert and oriented MSK:  Left shoulder:  Unable to abduct  Pain with ER  Normal strength to resistance with IR and ER  Exam limited  Neurovascularly intact   Limited ultrasound: Left shoulder:  Encircling effusion of the biceps tendon. Effusion noted in the posterior glenohumeral joint  Summary:  Effusion which could suggest exacerbation of underlying degenerative changes or labral tear.  Ultrasound and interpretation by Clare GandyJeremy Asbury Hair, MD    Aspiration/Injection Procedure Note Nathan Brandt August 15, 1965  Procedure: Injection Indications: Left shoulder pain  Procedure Details Consent: Risks of procedure as well as the alternatives and risks of each were explained to the (patient/caregiver).  Consent for procedure obtained. Time Out: Verified patient identification, verified procedure, site/side was marked, verified correct patient position, special equipment/implants available, medications/allergies/relevent history reviewed, required imaging and test results available.  Performed.  The area was cleaned with iodine and alcohol swabs.    The left canal humeral joint was injected using 1 cc's of 40 mg Kenalog, 3 cc of 1% lidocaine without epinephrine, and 3 cc's of 0.25% bupivacaine with a 22 3 1/2" needle.  Ultrasound was used. Images were obtained in trans-views showing the injection.     A sterile dressing was applied.  Patient did tolerate procedure well.     ASSESSMENT & PLAN:   Adhesive capsulitis of left shoulder Significant loss of his ROM.  Has an effusion in the posterior glenohumeral joint.  May have a component of degenerative changes or a labral issue. -Glenohumeral injection  - counseled on HEp and supportive care - if no improvement would consider PT.

## 2019-01-04 ENCOUNTER — Telehealth: Payer: Self-pay | Admitting: Family Medicine

## 2019-01-04 NOTE — Telephone Encounter (Signed)
Unable to leave VM for patient. If he calls back please have him speak with a nurse/CMA and inform that we will have to discuss the medication in the next appt.   If any questions then please take the best time and phone number to call and I will try to call him back.   Rosemarie Ax, MD [facility] 01/04/2019, 11:16 AM

## 2019-01-04 NOTE — Telephone Encounter (Signed)
Patient called requesting a short course of Hydrocodone. States his pain has gotten worse and is unable to sleep.   He has moved his follow up appointment to tomorrow, 7/28

## 2019-01-05 ENCOUNTER — Ambulatory Visit (INDEPENDENT_AMBULATORY_CARE_PROVIDER_SITE_OTHER): Payer: 59 | Admitting: Family Medicine

## 2019-01-05 ENCOUNTER — Encounter: Payer: Self-pay | Admitting: Family Medicine

## 2019-01-05 ENCOUNTER — Other Ambulatory Visit: Payer: Self-pay

## 2019-01-05 DIAGNOSIS — M19012 Primary osteoarthritis, left shoulder: Secondary | ICD-10-CM

## 2019-01-05 MED ORDER — PREDNISONE 5 MG PO TABS: ORAL_TABLET | ORAL | 0 refills | Status: DC

## 2019-01-05 MED FILL — predniSONE 5 MG TABS: 5 | 6 days supply | Qty: 21 | Fill #0

## 2019-01-05 NOTE — Progress Notes (Signed)
Nathan Brandt - 53 y.o. male MRN [id number]  Date of birth: May 17, 1966  SUBJECTIVE:  Including CC & ROS.  Chief Complaint  Patient presents with  . Follow-up    follow up for left shoulder    Nathan Brandt is a 53 y.o. male that is presenting with acute left shoulder pain.  He was provided an intra-articular glenohumeral injection on 715.  He had significant improvement of his symptoms at that time.  He was back to doing normal activities with no intubations.  This past weekend he was playing around and an exacerbation of the pain that he suffered previously.  He has problems with movement of the shoulder in any direction.  The pain is severe and intense.  It is worse with lying on the affected side.  It is localized to the shoulder but does have some radiation to the elbow.  He is not able to sleep.  He denies any bruising.    Review of Systems  Constitutional: Negative for fever.  HENT: Negative for congestion.   Respiratory: Negative for cough.   Cardiovascular: Negative for chest pain.  Gastrointestinal: Negative for abdominal pain.  Musculoskeletal: Positive for arthralgias and joint swelling.  Skin: Negative for color change.  Neurological: Negative for weakness.  Hematological: Negative for adenopathy.    HISTORY: Past Medical, Surgical, Social, and Family History Reviewed & Updated per EMR.   Pertinent Historical Findings include:  Past Medical History:  Diagnosis Date  . 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 Hypertension     Past Surgical History:  Procedure Laterality Date  . KIDNEY SURGERY    . KIDNEY SURGERY     left side surgery. 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    Allergies  Allergen Reactions  . Prednisone Anaphylaxis    uvula swollen    Family History  Problem Relation Age of Onset  . Cancer Mother   . Diabetes Father      Social History   Socioeconomic History  . Marital status: Married    Spouse name: Not on  file  . Number of children: Not on file  . Years of education: Not on file  . Highest education level: Not on file  Occupational History  . Not on file  Social Needs  . Financial resource strain: Not on file  . Food insecurity    Worry: Not on file    Inability: Not on file  . Transportation needs    Medical: Not on file    Non-medical: Not on file  Tobacco Use  . Smoking status: Never Smoker  . Smokeless tobacco: Never Used  Substance and Sexual Activity  . Alcohol use: Yes    Frequency: Never    Comment: 2 glasses of wine every 3 months.  . Drug use: Never  . Sexual activity: Yes  Lifestyle  . Physical activity    Days per week: Not on file    Minutes per session: Not on file  . Stress: Not on file  Relationships  . Social Herbalist on phone: Not on file    Gets together: Not on file    Attends religious service: Not on file    Active member of club or organization: Not on file    Attends meetings of clubs or organizations: Not on file    Relationship status: Not on file  . Intimate partner violence    Fear of current or ex partner: Not on file  Emotionally abused: Not on file    Physically abused: Not on file    Forced sexual activity: Not on file  Other Topics Concern  . Not on file  Social History Narrative  . Not on file     PHYSICAL EXAM:  VS: BP (!) 165/122   Pulse 96   Ht 5\' 4"  (1.626 m)   Wt 235 lb (106.6 kg)   BMI 40.34 kg/m  Physical Exam Gen: NAD, alert, cooperative with exam, well-appearing ENT: normal lips, normal nasal mucosa,  Eye: normal EOM, normal conjunctiva and lids CV:  no edema, +2 pedal pulses   Resp: no accessory muscle use, non-labored,  Skin: no rashes, no areas of induration  Neuro: normal tone, normal sensation to touch Psych:  normal insight, alert and oriented MSK:  Left shoulder: Severely restricted external rotation. Pain with any movement. Normal grip strength. No overlying swelling or redness.  Neurovascular intact     ASSESSMENT & PLAN:   OA (osteoarthritis) of shoulder I feel that his symptoms are more associated with degenerative changes of the joint as opposed to a frozen shoulder.  He had significant resolution with the glenohumeral injection.  Today presenting with the same restrictions and clinical exam that he was on 7/15. -Prednisone.  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 -Could consider a Toradol glenohumeral injection. -If continues may need to consider CT scan or MRI to evaluate the joint

## 2019-01-05 NOTE — Assessment & Plan Note (Signed)
I feel that his symptoms are more associated with degenerative changes of the joint as opposed to a frozen shoulder.  He had significant resolution with the glenohumeral injection.  Today presenting with the same restrictions and clinical exam that he was on 7/15. -Prednisone.  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 -Could consider a Toradol glenohumeral injection. -If continues may need to consider CT scan or MRI to evaluate the joint

## 2019-01-05 NOTE — Patient Instructions (Signed)
Good to see you Please try the prednisone. Please stop it if you have any problems taking it.  Please try ice on the area   Please send me a message in MyChart with any questions or updates.  Please see me back in 3-4 weeks.   --Dr. Raeford Razor

## 2019-01-19 ENCOUNTER — Encounter: Payer: Self-pay | Admitting: Family Medicine

## 2019-01-19 ENCOUNTER — Other Ambulatory Visit: Payer: Self-pay | Admitting: Family Medicine

## 2019-01-19 DIAGNOSIS — M19012 Primary osteoarthritis, left shoulder: Secondary | ICD-10-CM

## 2019-01-19 NOTE — Progress Notes (Signed)
Will place MRi to evaluate joint for loose debris, OA or synovitis of left shoulder.   Rosemarie Ax, MD [facility] 01/19/2019, 9:26 AM

## 2019-01-20 ENCOUNTER — Ambulatory Visit: Payer: 59 | Admitting: Family Medicine

## 2019-01-25 ENCOUNTER — Ambulatory Visit: Payer: 59 | Admitting: Medical

## 2019-01-26 ENCOUNTER — Ambulatory Visit: Payer: 59 | Admitting: Family Medicine

## 2019-01-26 NOTE — Progress Notes (Deleted)
  Nathan Brandt - 53 y.o. male MRN [id number]  Date of birth: 02-14-1966  SUBJECTIVE:  Including CC & ROS.  No chief complaint on file.   Nathan Brandt is a 53 y.o. male that is  ***.  ***   Review of Systems  HISTORY: Past Medical, Surgical, Social, and Family History Reviewed & Updated per EMR.   Pertinent Historical Findings include:  Past Medical History:  Diagnosis Date  . 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 Hypertension     Past Surgical History:  Procedure Laterality Date  . KIDNEY SURGERY    . KIDNEY SURGERY     left side surgery. 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    Allergies  Allergen Reactions  . Prednisone Anaphylaxis    uvula swollen    Family History  Problem Relation Age of Onset  . Cancer Mother   . Diabetes Father      Social History   Socioeconomic History  . Marital status: Married    Spouse name: Not on file  . Number of children: Not on file  . Years of education: Not on file  . Highest education level: Not on file  Occupational History  . Not on file  Social Needs  . Financial resource strain: Not on file  . Food insecurity    Worry: Not on file    Inability: Not on file  . Transportation needs    Medical: Not on file    Non-medical: Not on file  Tobacco Use  . Smoking status: Never Smoker  . Smokeless tobacco: Never Used  Substance and Sexual Activity  . Alcohol use: Yes    Frequency: Never    Comment: 2 glasses of wine every 3 months.  . Drug use: Never  . Sexual activity: Yes  Lifestyle  . Physical activity    Days per week: Not on file    Minutes per session: Not on file  . Stress: Not on file  Relationships  . Social Herbalist on phone: Not on file    Gets together: Not on file    Attends religious service: Not on file    Active member of club or organization: Not on file    Attends meetings of clubs or organizations: Not on file    Relationship status: Not on file  .  Intimate partner violence    Fear of current or ex partner: Not on file    Emotionally abused: Not on file    Physically abused: Not on file    Forced sexual activity: Not on file  Other Topics Concern  . Not on file  Social History Narrative  . Not on file     PHYSICAL EXAM:  VS: There were no vitals taken for this visit. Physical Exam Gen: NAD, alert, cooperative with exam, well-appearing ENT: normal lips, normal nasal mucosa,  Eye: normal EOM, normal conjunctiva and lids CV:  no edema, +2 pedal pulses   Resp: no accessory muscle use, non-labored,  GI: no masses or tenderness, no hernia  Skin: no rashes, no areas of induration  Neuro: normal tone, normal sensation to touch Psych:  normal insight, alert and oriented MSK:  ***      ASSESSMENT & PLAN:   No problem-specific Assessment & Plan notes found for this encounter.

## 2019-02-09 ENCOUNTER — Other Ambulatory Visit: Payer: Self-pay

## 2019-02-09 ENCOUNTER — Ambulatory Visit
Admission: RE | Admit: 2019-02-09 | Discharge: 2019-02-09 | Disposition: A | Discharge status: Home / Self Care | Payer: 59 | Source: Ambulatory Visit | Attending: Family Medicine | Admitting: Family Medicine

## 2019-02-09 DIAGNOSIS — M75112 Incomplete rotator cuff tear or rupture of left shoulder, not specified as traumatic: Secondary | ICD-10-CM | POA: Diagnosis not present

## 2019-02-09 DIAGNOSIS — M19012 Primary osteoarthritis, left shoulder: Secondary | ICD-10-CM

## 2019-02-10 ENCOUNTER — Ambulatory Visit (INDEPENDENT_AMBULATORY_CARE_PROVIDER_SITE_OTHER): Payer: 59 | Admitting: Family Medicine

## 2019-02-10 ENCOUNTER — Other Ambulatory Visit: Payer: Self-pay

## 2019-02-10 DIAGNOSIS — M19012 Primary osteoarthritis, left shoulder: Secondary | ICD-10-CM

## 2019-02-10 NOTE — Progress Notes (Signed)
Virtual Visit via Telephone Note  I connected with Nathan Brandt on 02/10/19 at  4:10 PM EDT by telephone and verified that I am speaking with the correct person using two identifiers.   I discussed the limitations, risks, security and privacy concerns of performing an evaluation and management service by telephone and the availability of in person appointments. I also discussed with the patient that there may be a patient responsible charge related to this service. The patient expressed understanding and agreed to proceed.   History of Present Illness:  Nathan Brandt is a 53 year old male that is following up for his left shoulder pain.  An MRI was conducted and showed significant degenerative change the glenohumeral joint as well as loose body and tendinitis in different areas of the rotator cuff.  He reports his pain is been intermittent since his glenohumeral injection.  He did get relief with the prior glenohumeral injection.   Observations/Objective:  Gen: NAD, alert,    Assessment and Plan:  He has significant degenerative changes of the joint which is likely the cause of his ongoing pain.  He also has loose bodies which can cause mechanical symptoms -Counseled on home exercise therapy and supportive care. -Counseled on need for possible shoulder replacement.  Would send to Dr. Tamera Punt at [place]. -Could consider physical therapy prior to surgery.  Follow Up Instructions:    I discussed the assessment and treatment plan with the patient. The patient was provided an opportunity to ask questions and all were answered. The patient agreed with the plan and demonstrated an understanding of the instructions.   The patient was advised to call back or seek an in-person evaluation if the symptoms worsen or if the condition fails to improve as anticipated.  I provided 6 minutes of non-face-to-face time during this encounter.   Clearance Coots, MD

## 2019-02-10 NOTE — Assessment & Plan Note (Signed)
He has significant degenerative changes of the joint which is likely the cause of his ongoing pain.  He also has loose bodies which can cause mechanical symptoms -Counseled on home exercise therapy and supportive care. -Counseled on need for possible shoulder replacement.  Would send to Dr. Tamera Punt at [place]. -Could consider physical therapy prior to surgery.

## 2019-02-23 ENCOUNTER — Other Ambulatory Visit: Payer: 59

## 2019-03-05 MED FILL — LOSARTAN POTASSIUM 100 MG T: 100 | 30 days supply | Qty: 30 | Fill #1

## 2019-03-05 MED FILL — CELECOXIB 200 MG CAP: 200 | 30 days supply | Qty: 30 | Fill #1

## 2019-04-19 ENCOUNTER — Other Ambulatory Visit: Payer: Self-pay | Admitting: Medical

## 2019-04-19 MED FILL — LOSARTAN POTASSIUM 100 MG T: 100 | 90 days supply | Qty: 90 | Fill #0

## 2019-05-24 ENCOUNTER — Ambulatory Visit (INDEPENDENT_AMBULATORY_CARE_PROVIDER_SITE_OTHER): Payer: 59 | Admitting: Family Medicine

## 2019-05-24 ENCOUNTER — Encounter: Payer: Self-pay | Admitting: Family Medicine

## 2019-05-24 ENCOUNTER — Other Ambulatory Visit: Payer: Self-pay

## 2019-05-24 VITALS — BP 128/86 | HR 85 | Temp 97.0°F | Ht 64.0 in | Wt 253.0 lb

## 2019-05-24 DIAGNOSIS — M545 Low back pain, unspecified: Secondary | ICD-10-CM

## 2019-05-24 LAB — BASIC METABOLIC PANEL
BUN: 16 mg/dL (ref 6–23)
CO2: 29 mEq/L (ref 19–32)
Calcium: 9.4 mg/dL (ref 8.4–10.5)
Chloride: 104 mEq/L (ref 96–112)
Creatinine, Ser: 1.1 mg/dL (ref 0.40–1.50)
GFR: 70.24 mL/min (ref >60.00)
Glucose, Bld: 94 mg/dL (ref 70–99)
Potassium: 4.3 mEq/L (ref 3.5–5.1)
Sodium: 140 mEq/L (ref 135–145)

## 2019-05-24 MED ORDER — MELOXICAM 15 MG PO TABS: 15.0000 mg | ORAL_TABLET | Freq: Every day | ORAL | 0 refills | Status: DC

## 2019-05-24 MED ORDER — CYCLOBENZAPRINE HCL 10 MG PO TABS: 5.0000 mg | ORAL_TABLET | Freq: Three times a day (TID) | ORAL | 0 refills | Status: DC | PRN

## 2019-05-24 MED FILL — MELOXICAM 15 MG TABLET: 15 | 30 days supply | Qty: 30 | Fill #0

## 2019-05-24 MED FILL — CYCLOBENZAPRINE HCL 10 MG T: 10 | 7 days supply | Qty: 21 | Fill #0

## 2019-05-24 NOTE — Progress Notes (Signed)
Musculoskeletal Exam  Patient: Nathan Brandt DOB: [birth year]  DOS: 05/24/2019  SUBJECTIVE:  Chief Complaint:   Chief Complaint  Patient presents with  . Pain    lower back for one day    Nathan Brandt is a 53 y.o.  male for evaluation and treatment of his back pain.   Onset:  1 day ago.  No inj or change in actvity.  Location: lower Character:  tightness  Progression of issue:  is unchanged Associated symptoms: swelling Denies bowel/bladder incontinence or weakness Treatment: to date has been none.   Neurovascular symptoms: no  ROS: Musculoskeletal/Extremities: +back pain Neurologic: no numbness, tingling no weakness   Past Medical History:  Diagnosis Date  . 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 Hypertension     Objective:  VITAL SIGNS: BP 128/86 (BP Location: Left Arm, Patient Position: Sitting, Cuff Size: Large)   Pulse 85   Temp (!) 97 F (36.1 C) (Temporal)   Ht 5\' 4"  (1.626 m)   Wt 253 lb (114.8 kg)   SpO2 96%   BMI 43.43 kg/m  Constitutional: Well formed, well developed. No acute distress. HENT: Normocephalic, atraumatic.  Thorax & Lungs:  No accessory muscle use Skin: Warm. Dry. No erythema. No rash.  Musculoskeletal: low back.   Tenderness to palpation: yes over R lower lumbar parasp msc and distal ES group Deformity: no Ecchymosis: no Straight leg test: negative for Slightly poor hamstring flexibility b/l. Neurologic: Normal sensory function. No focal deficits noted. DTR's equal and symmetric in LE's. No clonus. Psychiatric: Normal mood. Age appropriate judgment and insight. Alert & oriented x 3.    Assessment:  Acute right-sided low back pain without sciatica - Plan: Basic Metabolic Panel (BMET), cyclobenzaprine (FLEXERIL) 10 MG tablet, meloxicam (MOBIC) 15 MG tablet  Plan: Orders as above. Stretches/exercises, heat, ice, Tylenol, NSAIDs.  Warning about Flexeril noted. BMP with history of hydronephrosis with similar  symptoms on the left. F/u prn. The patient voiced understanding and agreement to the plan.   [place], DO 05/24/19  12:07 PM

## 2019-05-24 NOTE — Patient Instructions (Addendum)
OK to take Tylenol 1000 mg (2 extra strength tabs) or 975 mg (3 regular strength tabs) every 6 hours as needed.  Ice/cold pack over area for 10-15 min twice daily.  Heat (pad or rice pillow in microwave) over affected area, 10-15 minutes twice daily.   Take Flexeril (cyclobenzaprine) 1-2 hours before planned bedtime. If it makes you drowsy, do not take during the day. You can try half a tab the following night.  EXERCISES  RANGE OF MOTION (ROM) AND STRETCHING EXERCISES - Low Back Pain Most people with lower back pain will find that their symptoms get worse with excessive bending forward (flexion) or arching at the lower back (extension). The exercises that will help resolve your symptoms will focus on the opposite motion.  If you have pain, numbness or tingling which travels down into your buttocks, leg or foot, the goal of the therapy is for these symptoms to move closer to your back and eventually resolve. Sometimes, these leg symptoms will get better, but your lower back pain may worsen. This is often an indication of progress in your rehabilitation. Be very alert to any changes in your symptoms and the activities in which you participated in the 24 hours prior to the change. Sharing this information with your caregiver will allow him or her to most efficiently treat your condition. These exercises may help you when beginning to rehabilitate your injury. Your symptoms may resolve with or without further involvement from your physician, physical therapist or athletic trainer. While completing these exercises, remember:   Restoring tissue flexibility helps normal motion to return to the joints. This allows healthier, less painful movement and activity.  An effective stretch should be held for at least 30 seconds.  A stretch should never be painful. You should only feel a gentle lengthening or release in the stretched tissue. FLEXION RANGE OF MOTION AND STRETCHING EXERCISES:  STRETCH - Flexion,  Single Knee to Chest   Lie on a firm bed or floor with both legs extended in front of you.  Keeping one leg in contact with the floor, bring your opposite knee to your chest. Hold your leg in place by either grabbing behind your thigh or at your knee.  Pull until you feel a gentle stretch in your low back. Hold 30 seconds.  Slowly release your grasp and repeat the exercise with the opposite side. Repeat 2 times. Complete this exercise 3 times per week.   STRETCH - Flexion, Double Knee to Chest  Lie on a firm bed or floor with both legs extended in front of you.  Keeping one leg in contact with the floor, bring your opposite knee to your chest.  Tense your stomach muscles to support your back and then lift your other knee to your chest. Hold your legs in place by either grabbing behind your thighs or at your knees.  Pull both knees toward your chest until you feel a gentle stretch in your low back. Hold 30 seconds.  Tense your stomach muscles and slowly return one leg at a time to the floor. Repeat 2 times. Complete this exercise 3 times per week.   STRETCH - Low Trunk Rotation  Lie on a firm bed or floor. Keeping your legs in front of you, bend your knees so they are both pointed toward the ceiling and your feet are flat on the floor.  Extend your arms out to the side. This will stabilize your upper body by keeping your shoulders in contact with  the floor.  Gently and slowly drop both knees together to one side until you feel a gentle stretch in your low back. Hold for 30 seconds.  Tense your stomach muscles to support your lower back as you bring your knees back to the starting position. Repeat the exercise to the other side. Repeat 2 times. Complete this exercise at least 3 times per week.   EXTENSION RANGE OF MOTION AND FLEXIBILITY EXERCISES:  STRETCH - Extension, Prone on Elbows   Lie on your stomach on the floor, a bed will be too soft. Place your palms about shoulder  width apart and at the height of your head.  Place your elbows under your shoulders. If this is too painful, stack pillows under your chest.  Allow your body to relax so that your hips drop lower and make contact more completely with the floor.  Hold this position for 30 seconds.  Slowly return to lying flat on the floor. Repeat 2 times. Complete this exercise 3 times per week.   RANGE OF MOTION - Extension, Prone Press Ups  Lie on your stomach on the floor, a bed will be too soft. Place your palms about shoulder width apart and at the height of your head.  Keeping your back as relaxed as possible, slowly straighten your elbows while keeping your hips on the floor. You may adjust the placement of your hands to maximize your comfort. As you gain motion, your hands will come more underneath your shoulders.  Hold this position 30 seconds.  Slowly return to lying flat on the floor. Repeat 2 times. Complete this exercise 3 times per week.   RANGE OF MOTION- Quadruped, Neutral Spine   Assume a hands and knees position on a firm surface. Keep your hands under your shoulders and your knees under your hips. You may place padding under your knees for comfort.  Drop your head and point your tailbone toward the ground below you. This will round out your lower back like an angry cat. Hold this position for 30 seconds.  Slowly lift your head and release your tail bone so that your back sags into a large arch, like an old horse.  Hold this position for 30 seconds.  Repeat this until you feel limber in your low back.  Now, find your "sweet spot." This will be the most comfortable position somewhere between the two previous positions. This is your neutral spine. Once you have found this position, tense your stomach muscles to support your low back.  Hold this position for 30 seconds. Repeat 2 times. Complete this exercise 3 times per week.   STRENGTHENING EXERCISES - Low Back Sprain These  exercises may help you when beginning to rehabilitate your injury. These exercises should be done near your "sweet spot." This is the neutral, low-back arch, somewhere between fully rounded and fully arched, that is your least painful position. When performed in this safe range of motion, these exercises can be used for people who have either a flexion or extension based injury. These exercises may resolve your symptoms with or without further involvement from your physician, physical therapist or athletic trainer. While completing these exercises, remember:   Muscles can gain both the endurance and the strength needed for everyday activities through controlled exercises.  Complete these exercises as instructed by your physician, physical therapist or athletic trainer. Increase the resistance and repetitions only as guided.  You may experience muscle soreness or fatigue, but the pain or discomfort you are   trying to eliminate should never worsen during these exercises. If this pain does worsen, stop and make certain you are following the directions exactly. If the pain is still present after adjustments, discontinue the exercise until you can discuss the trouble with your caregiver.  STRENGTHENING - Deep Abdominals, Pelvic Tilt   Lie on a firm bed or floor. Keeping your legs in front of you, bend your knees so they are both pointed toward the ceiling and your feet are flat on the floor.  Tense your lower abdominal muscles to press your low back into the floor. This motion will rotate your pelvis so that your tail bone is scooping upwards rather than pointing at your feet or into the floor. With a gentle tension and even breathing, hold this position for 3 seconds. Repeat 2 times. Complete this exercise 3 times per week.   STRENGTHENING - Abdominals, Crunches   Lie on a firm bed or floor. Keeping your legs in front of you, bend your knees so they are both pointed toward the ceiling and your feet are  flat on the floor. Cross your arms over your chest.  Slightly tip your chin down without bending your neck.  Tense your abdominals and slowly lift your trunk high enough to just clear your shoulder blades. Lifting higher can put excessive stress on the lower back and does not further strengthen your abdominal muscles.  Control your return to the starting position. Repeat 2 times. Complete this exercise 3 times per week.   STRENGTHENING - Quadruped, Opposite UE/LE Lift   Assume a hands and knees position on a firm surface. Keep your hands under your shoulders and your knees under your hips. You may place padding under your knees for comfort.  Find your neutral spine and gently tense your abdominal muscles so that you can maintain this position. Your shoulders and hips should form a rectangle that is parallel with the floor and is not twisted.  Keeping your trunk steady, lift your right hand no higher than your shoulder and then your left leg no higher than your hip. Make sure you are not holding your breath. Hold this position for 30 seconds.  Continuing to keep your abdominal muscles tense and your back steady, slowly return to your starting position. Repeat with the opposite arm and leg. Repeat 2 times. Complete this exercise 3 times per week.   STRENGTHENING - Abdominals and Quadriceps, Straight Leg Raise   Lie on a firm bed or floor with both legs extended in front of you.  Keeping one leg in contact with the floor, bend the other knee so that your foot can rest flat on the floor.  Find your neutral spine, and tense your abdominal muscles to maintain your spinal position throughout the exercise.  Slowly lift your straight leg off the floor about 6 inches for a count of 3, making sure to not hold your breath.  Still keeping your neutral spine, slowly lower your leg all the way to the floor. Repeat this exercise with each leg 2 times. Complete this exercise 3 times per  week.  POSTURE AND BODY MECHANICS CONSIDERATIONS - Low Back Sprain Keeping correct posture when sitting, standing or completing your activities will reduce the stress put on different body tissues, allowing injured tissues a chance to heal and limiting painful experiences. The following are general guidelines for improved posture.  While reading these guidelines, remember:  The exercises prescribed by your provider will help you have the flexibility and   strength to maintain correct postures.  The correct posture provides the best environment for your joints to work. All of your joints have less wear and tear when properly supported by a spine with good posture. This means you will experience a healthier, less painful body.  Correct posture must be practiced with all of your activities, especially prolonged sitting and standing. Correct posture is as important when doing repetitive low-stress activities (typing) as it is when doing a single heavy-load activity (lifting).  RESTING POSITIONS Consider which positions are most painful for you when choosing a resting position. If you have pain with flexion-based activities (sitting, bending, stooping, squatting), choose a position that allows you to rest in a less flexed posture. You would want to avoid curling into a fetal position on your side. If your pain worsens with extension-based activities (prolonged standing, working overhead), avoid resting in an extended position such as sleeping on your stomach. Most people will find more comfort when they rest with their spine in a more neutral position, neither too rounded nor too arched. Lying on a non-sagging bed on your side with a pillow between your knees, or on your back with a pillow under your knees will often provide some relief. Keep in mind, being in any one position for a prolonged period of time, no matter how correct your posture, can still lead to stiffness.  PROPER SITTING POSTURE In order to  minimize stress and discomfort on your spine, you must sit with correct posture. Sitting with good posture should be effortless for a healthy body. Returning to good posture is a gradual process. Many people can work toward this most comfortably by using various supports until they have the flexibility and strength to maintain this posture on their own. When sitting with proper posture, your ears will fall over your shoulders and your shoulders will fall over your hips. You should use the back of the chair to support your upper back. Your lower back will be in a neutral position, just slightly arched. You may place a small pillow or folded towel at the base of your lower back for  support.  When working at a desk, create an environment that supports good, upright posture. Without extra support, muscles tire, which leads to excessive strain on joints and other tissues. Keep these recommendations in mind:  CHAIR:  A chair should be able to slide under your desk when your back makes contact with the back of the chair. This allows you to work closely.  The chair's height should allow your eyes to be level with the upper part of your monitor and your hands to be slightly lower than your elbows.  BODY POSITION  Your feet should make contact with the floor. If this is not possible, use a foot rest.  Keep your ears over your shoulders. This will reduce stress on your neck and low back.  INCORRECT SITTING POSTURES  If you are feeling tired and unable to assume a healthy sitting posture, do not slouch or slump. This puts excessive strain on your back tissues, causing more damage and pain. Healthier options include:  Using more support, like a lumbar pillow.  Switching tasks to something that requires you to be upright or walking.  Talking a brief walk.  Lying down to rest in a neutral-spine position.  PROLONGED STANDING WHILE SLIGHTLY LEANING FORWARD  When completing a task that requires you to  lean forward while standing in one place for a long time, place either   foot up on a stationary 2-4 inch high object to help maintain the best posture. When both feet are on the ground, the lower back tends to lose its slight inward curve. If this curve flattens (or becomes too large), then the back and your other joints will experience too much stress, tire more quickly, and can cause pain.  CORRECT STANDING POSTURES Proper standing posture should be assumed with all daily activities, even if they only take a few moments, like when brushing your teeth. As in sitting, your ears should fall over your shoulders and your shoulders should fall over your hips. You should keep a slight tension in your abdominal muscles to brace your spine. Your tailbone should point down to the ground, not behind your body, resulting in an over-extended swayback posture.   INCORRECT STANDING POSTURES  Common incorrect standing postures include a forward head, locked knees and/or an excessive swayback. WALKING Walk with an upright posture. Your ears, shoulders and hips should all line-up.  PROLONGED ACTIVITY IN A FLEXED POSITION When completing a task that requires you to bend forward at your waist or lean over a low surface, try to find a way to stabilize 3 out of 4 of your limbs. You can place a hand or elbow on your thigh or rest a knee on the surface you are reaching across. This will provide you more stability, so that your muscles do not tire as quickly. By keeping your knees relaxed, or slightly bent, you will also reduce stress across your lower back. CORRECT LIFTING TECHNIQUES  DO :  Assume a wide stance. This will provide you more stability and the opportunity to get as close as possible to the object which you are lifting.  Tense your abdominals to brace your spine. Bend at the knees and hips. Keeping your back locked in a neutral-spine position, lift using your leg muscles. Lift with your legs, keeping your  back straight.  Test the weight of unknown objects before attempting to lift them.  Try to keep your elbows locked down at your sides in order get the best strength from your shoulders when carrying an object.     Always ask for help when lifting heavy or awkward objects. INCORRECT LIFTING TECHNIQUES DO NOT:   Lock your knees when lifting, even if it is a small object.  Bend and twist. Pivot at your feet or move your feet when needing to change directions.  Assume that you can safely pick up even a paperclip without proper posture.

## 2019-07-23 MED FILL — LOSARTAN POTASSIUM 100 MG T: 100 | 30 days supply | Qty: 30 | Fill #1

## 2019-08-25 ENCOUNTER — Other Ambulatory Visit: Payer: Self-pay | Admitting: Medical

## 2019-08-25 MED FILL — LOSARTAN POTASSIUM 100 MG T: 100 | 90 days supply | Qty: 90 | Fill #0

## 2019-09-20 ENCOUNTER — Encounter: Payer: Self-pay | Admitting: Family Medicine

## 2019-11-29 MED FILL — LOSARTAN POTASSIUM 100 MG T: 100 | 30 days supply | Qty: 30 | Fill #1

## 2019-12-30 ENCOUNTER — Encounter: Payer: Self-pay | Admitting: Medical

## 2019-12-31 ENCOUNTER — Other Ambulatory Visit: Payer: Self-pay

## 2019-12-31 ENCOUNTER — Other Ambulatory Visit: Payer: Self-pay | Admitting: Medical

## 2019-12-31 MED FILL — LOSARTAN POTASSIUM 100 MG T: 100 | 90 days supply | Qty: 90 | Fill #0

## 2020-02-01 IMAGING — DX DG SHOULDER 2+V*R*
3 series · 3 of 3 positions shown · non-contrast
Comparison: None.

CLINICAL DATA: Chronic RIGHT shoulder pain and RIGHT-sided neck
pain. No known injuries.

EXAM:
RIGHT SHOULDER - 2+ VIEW

[shoulder grashey]
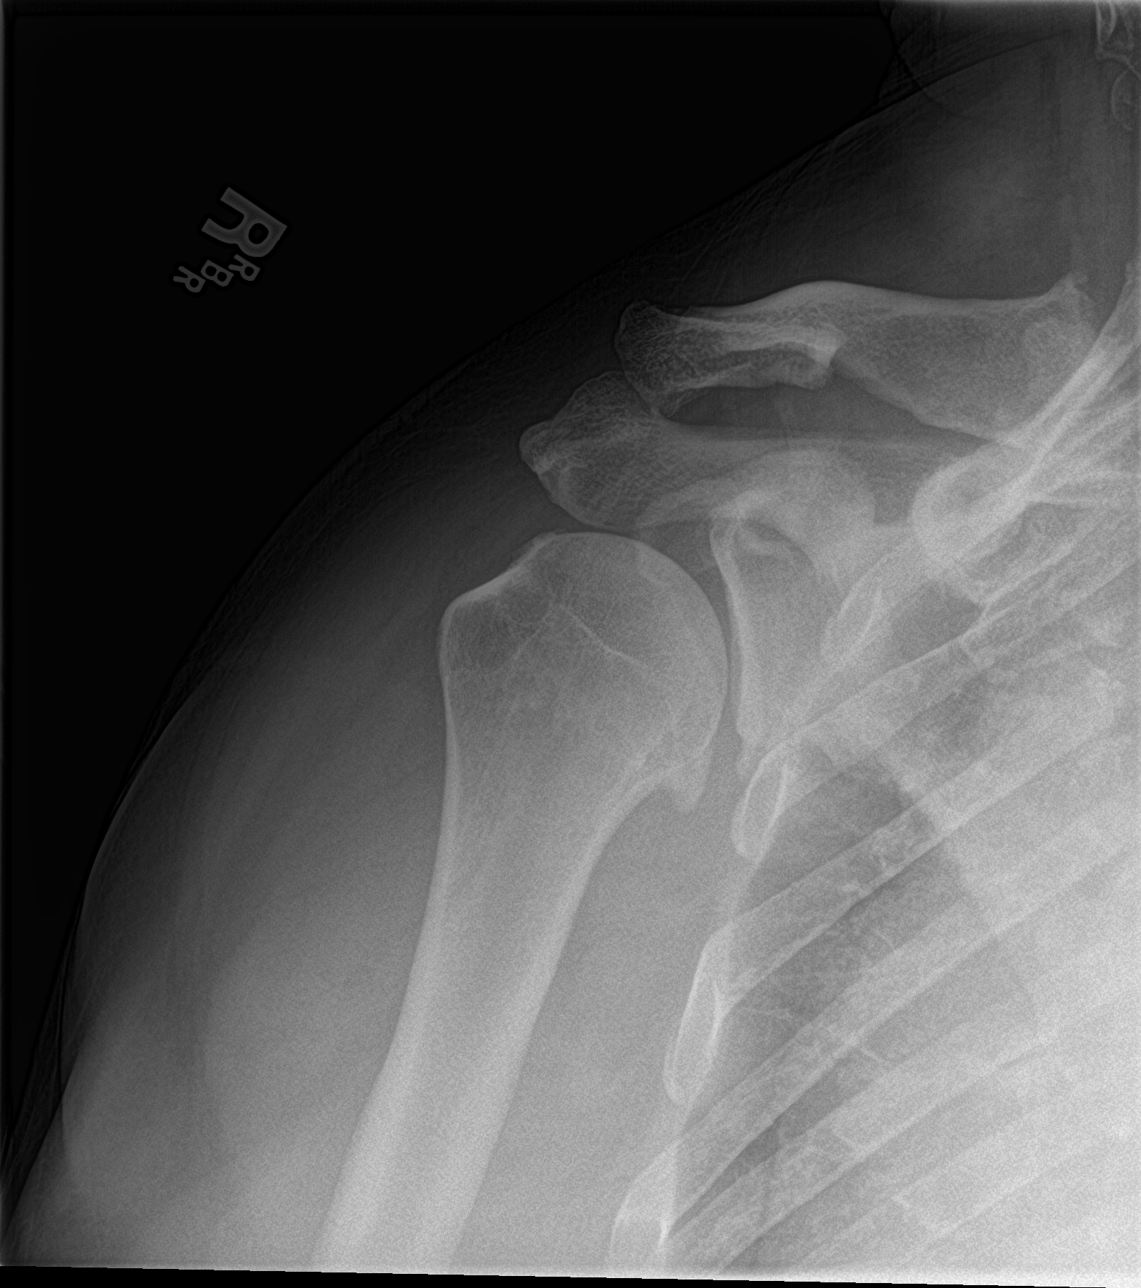

[shoulder y view]
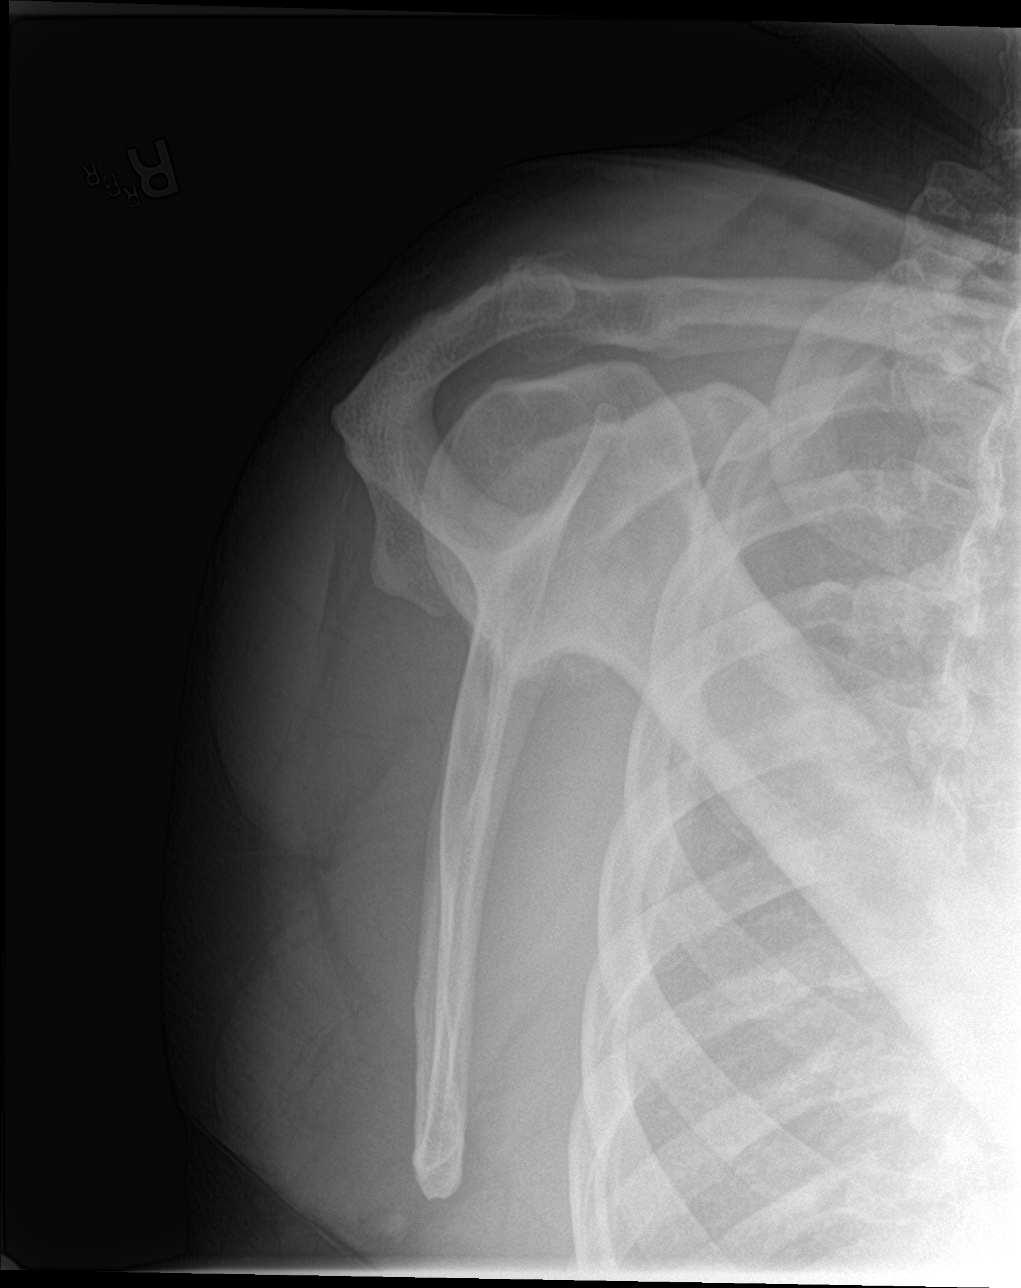

[shoulder axillary]
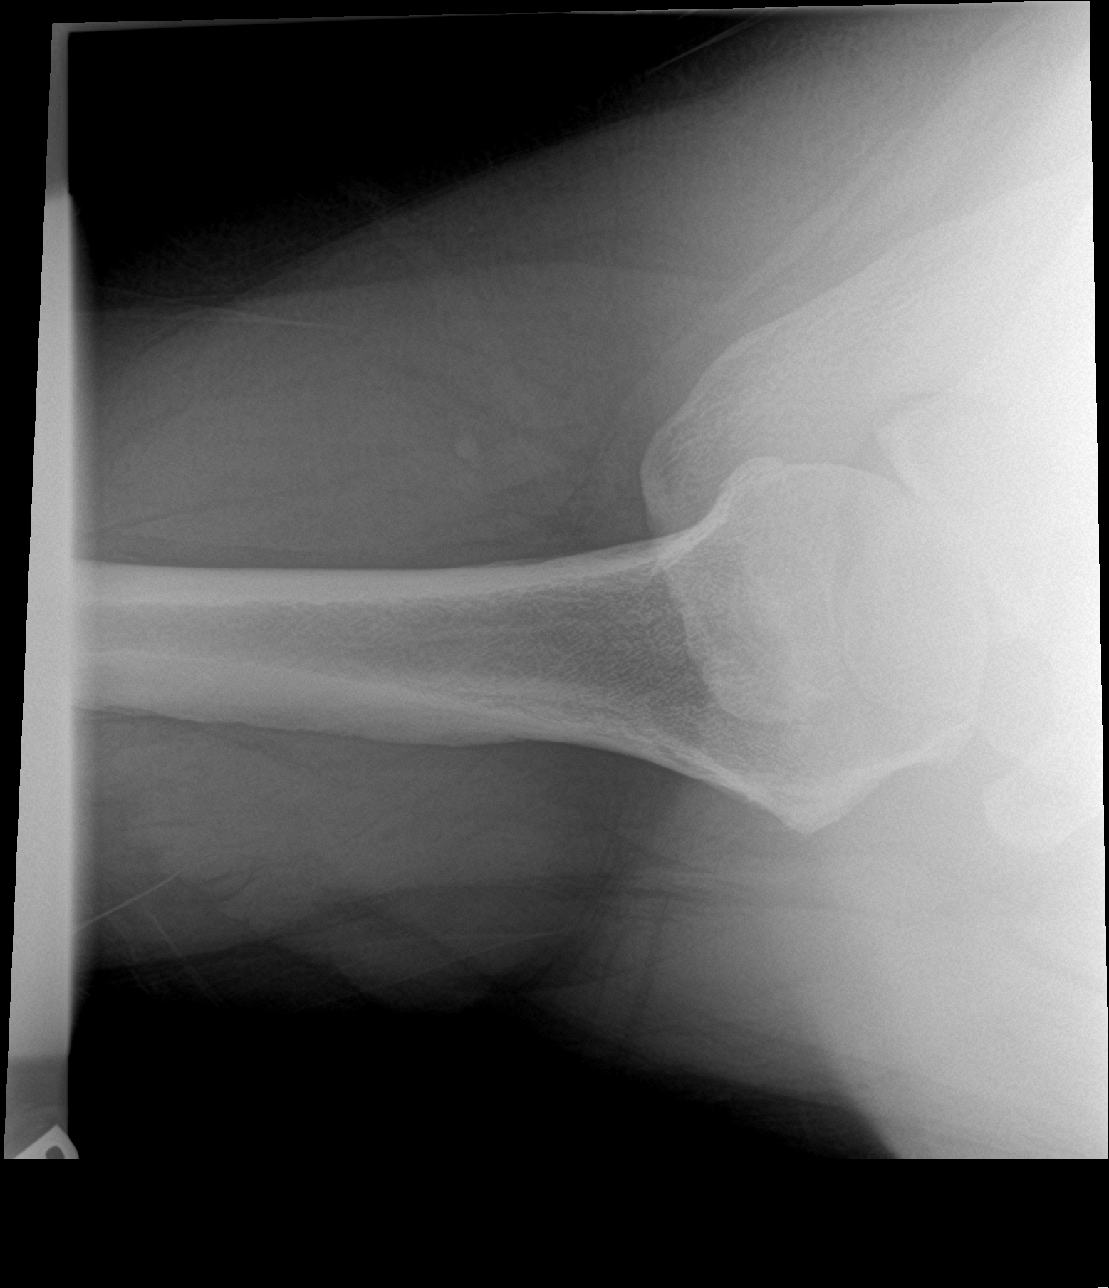

[3 of 3 positions shown; findings below may reference images not displayed]

FINDINGS: No evidence of acute fracture or glenohumeral dislocation. Narrowed
glenohumeral joint with hypertrophic spurring involving the INFERIOR
humeral head and the INFERIOR glenoid. Subacromial space
well-preserved. Acromioclavicular joint intact with mild
degenerative changes. Bone mineral density well-preserved.
IMPRESSION: Osteoarthritis involving the glenohumeral joint.

## 2020-02-01 IMAGING — DX DG CERVICAL SPINE COMPLETE 4+V
7 series · 7 of 7 positions shown · non-contrast
Comparison: None.

CLINICAL DATA: Chronic RIGHT shoulder pain and RIGHT-sided neck
pain. No known injuries.

EXAM:
CERVICAL SPINE - COMPLETE 4+ VIEW

[c-spine lat]
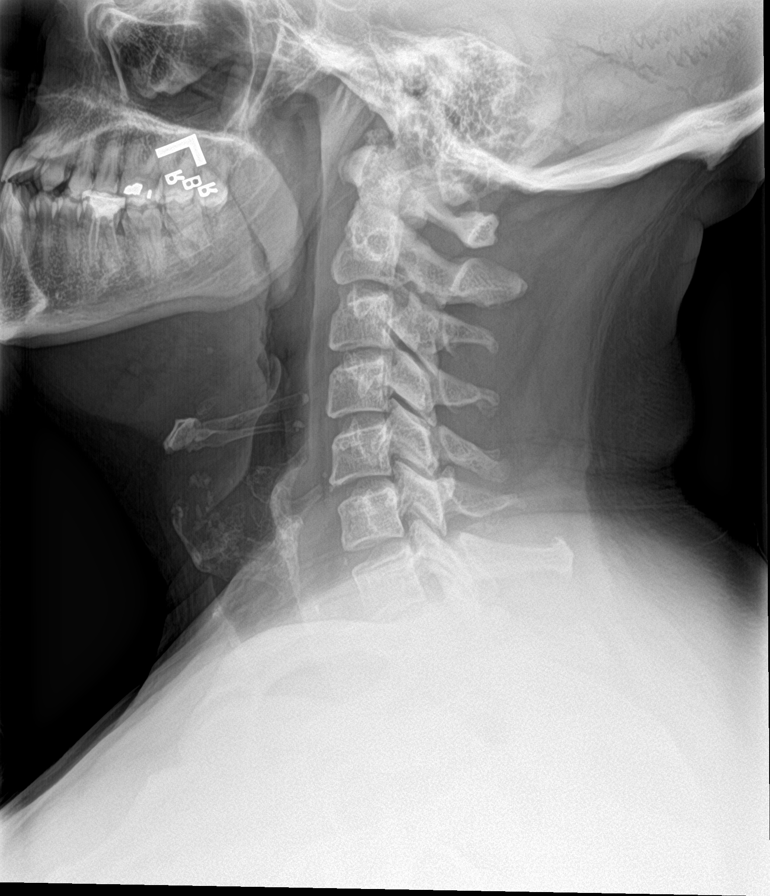

[c-spine obl (1 of 2)]
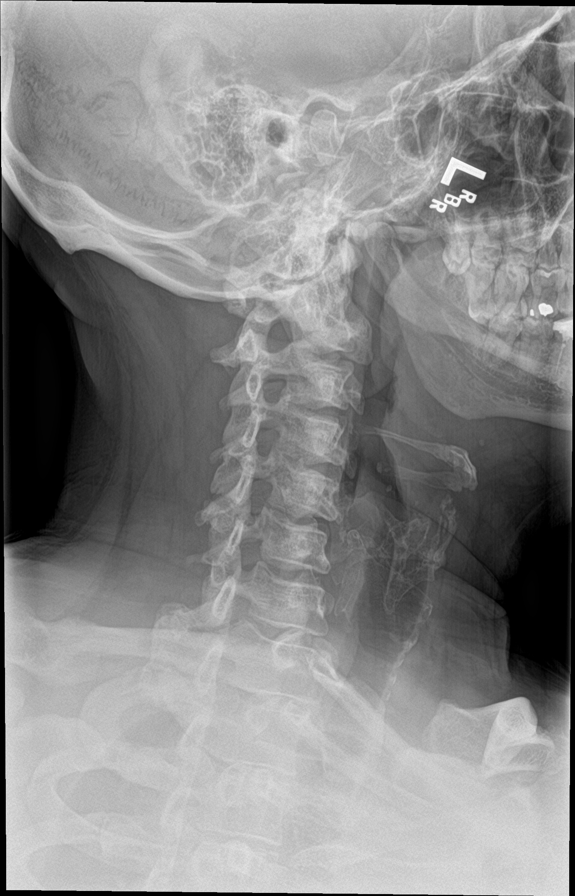

[c-spine obl (2 of 2)]
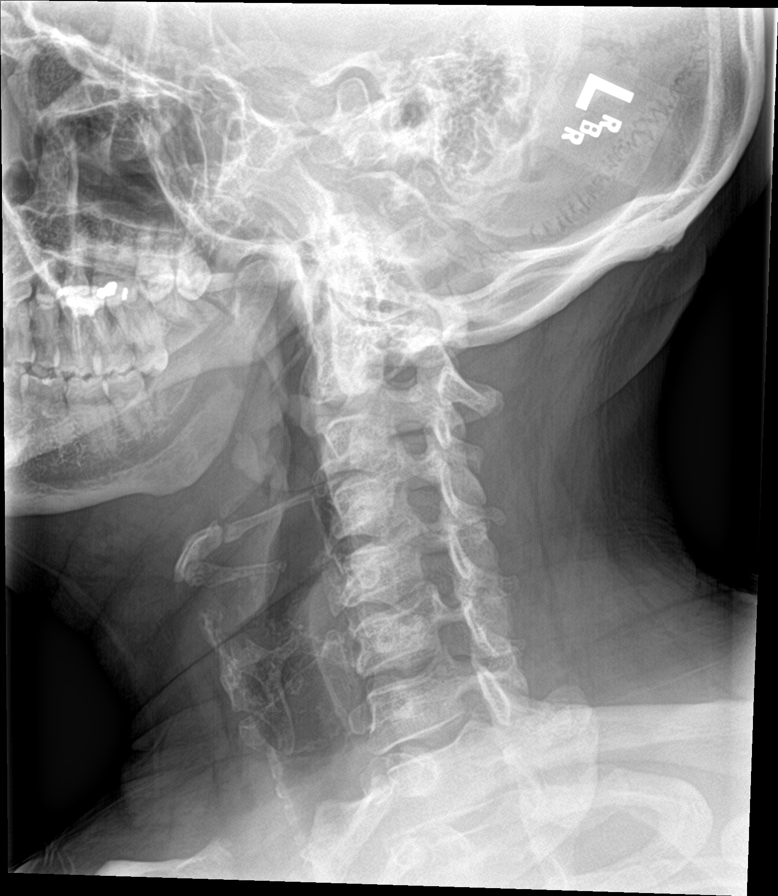

[c-spine ap]
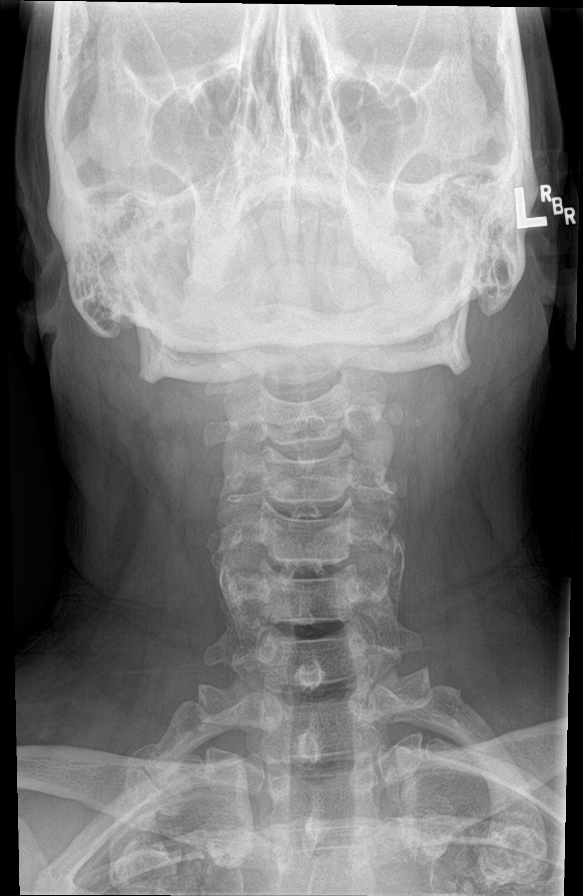

[c-spine open mouth]
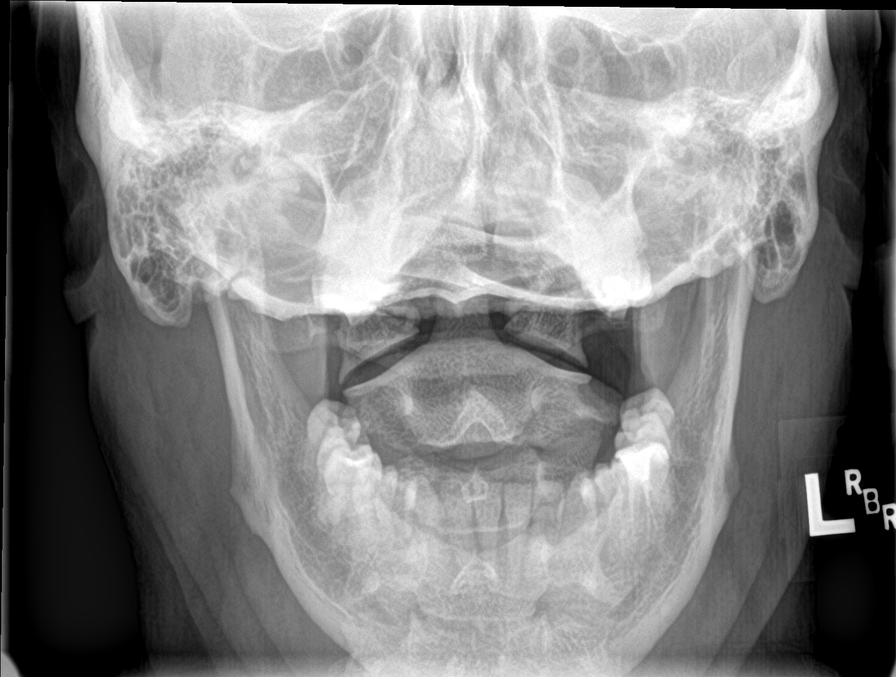

[c-spine swimmers]
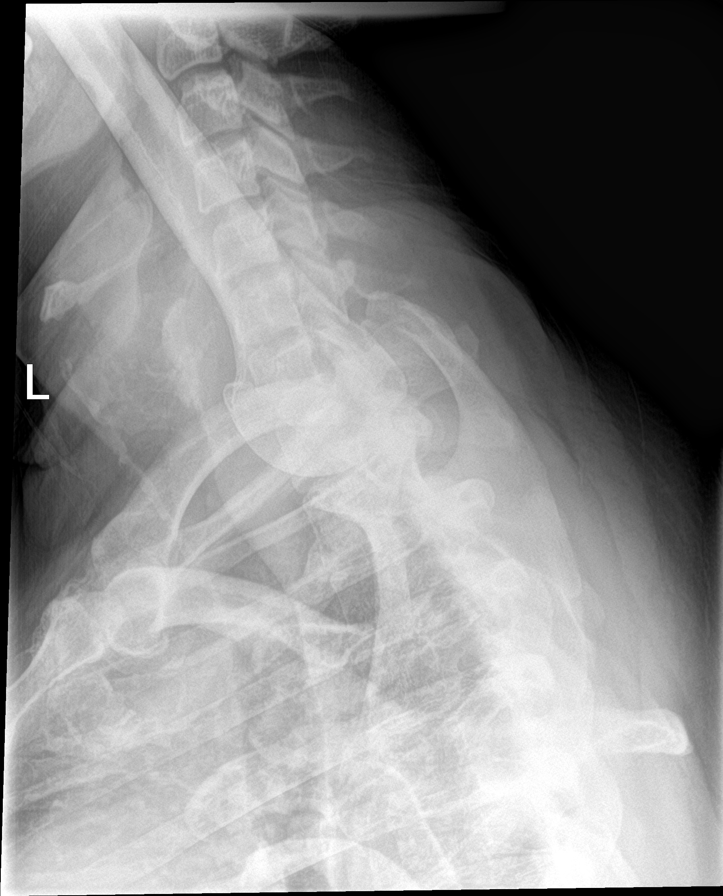

[[person_name]]
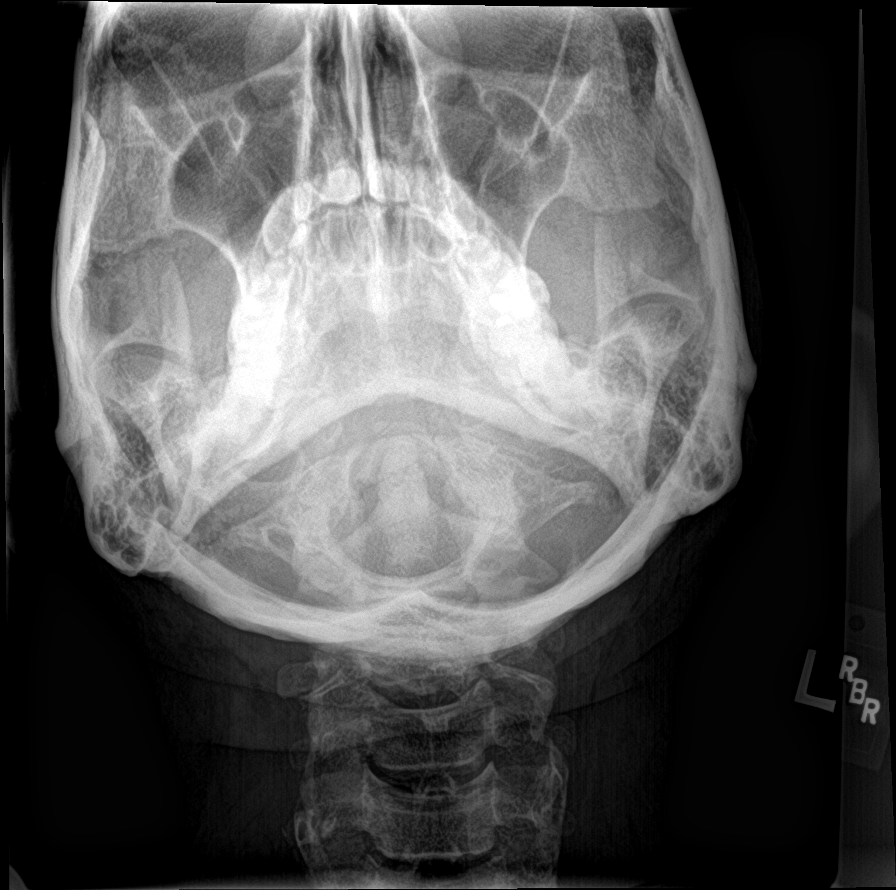

[7 of 7 positions shown; findings below may reference images not displayed]

FINDINGS: Anatomic alignment. No visible fractures. Mild disc space narrowing
at C4-5. Remaining disc spaces well-preserved. Facet joints intact.
No significant bony foraminal stenoses on the oblique views. No
static evidence of instability.
IMPRESSION: 1. Mild degenerative disc disease at C4-5.
2. Otherwise normal examination.

## 2020-02-01 IMAGING — CT CT HEAD W/O CM
3 series · 15 of 47 positions shown, 18 images · non-contrast
Comparison: None.

CLINICAL DATA: 52-year-old with two-month history of headaches,
presenting with an acute severe headache described as "the worst
headache of my life".

EXAM:
CT HEAD WITHOUT CONTRAST
TECHNIQUE: Contiguous axial images were obtained from the base of the skull
through the vertex without intravenous contrast.

[Series 2: head wo · axial · 0.44mm/px · z∈[-158,-28]mm · 9 of 32 slices shown, 12 images]
[im 3/32  brain]
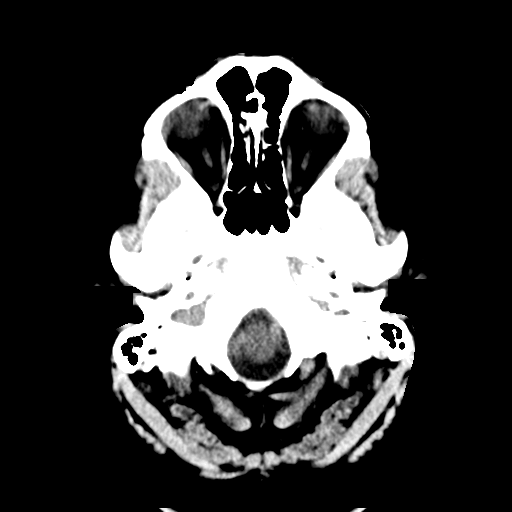
[im 3/32  bone]
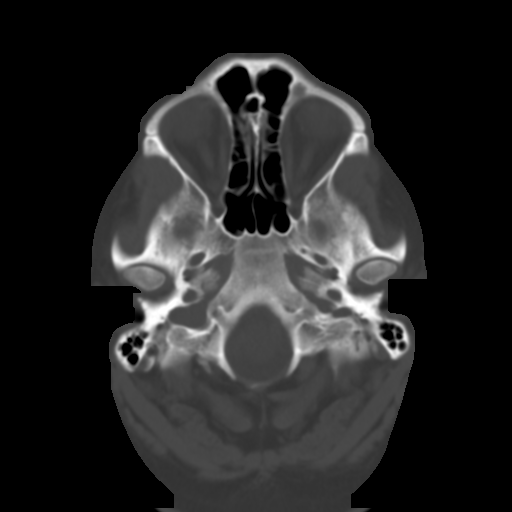
[im 6/32  brain]
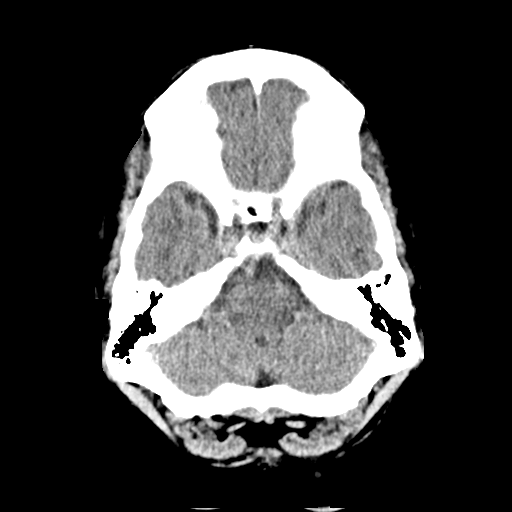
[im 9/32  brain]
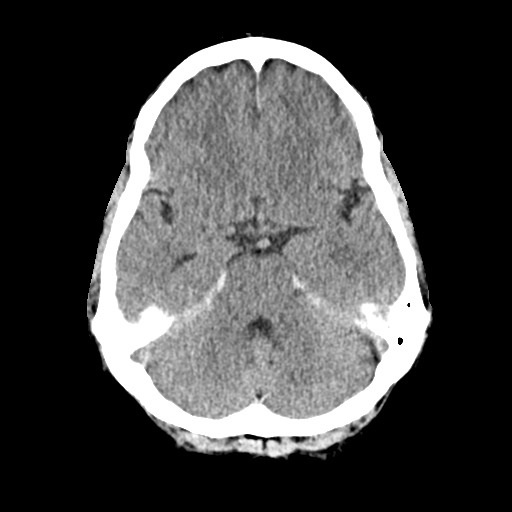
[im 12/32  brain]
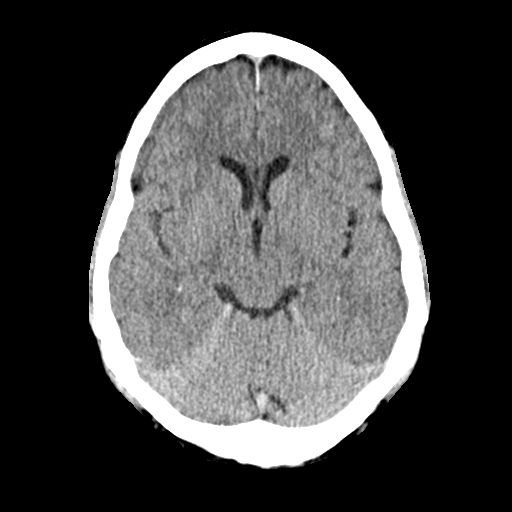
[im 17/32  brain]
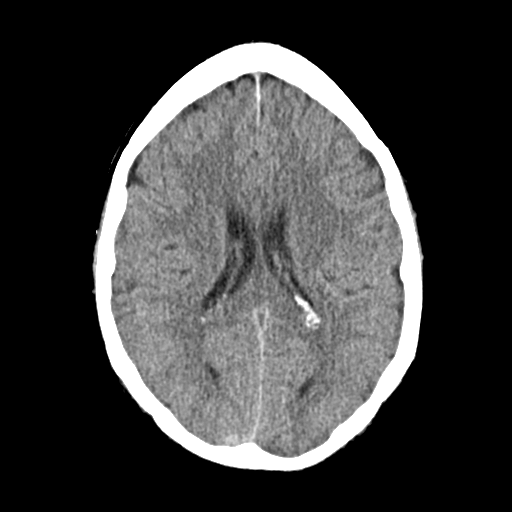
[im 17/32  bone]
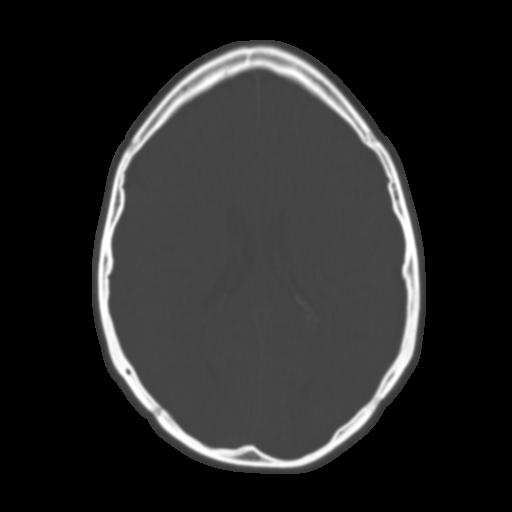
[im 20/32  brain]
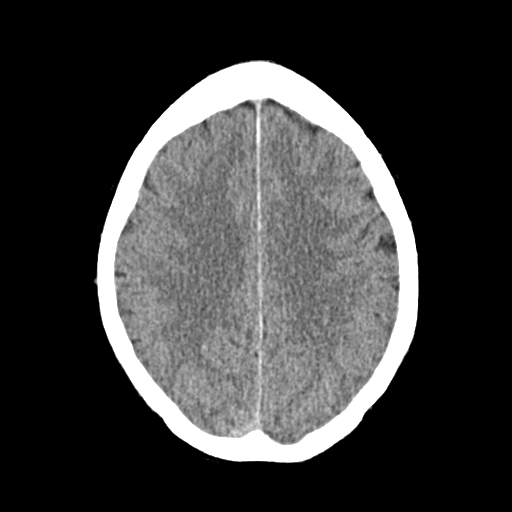
[im 23/32  brain]
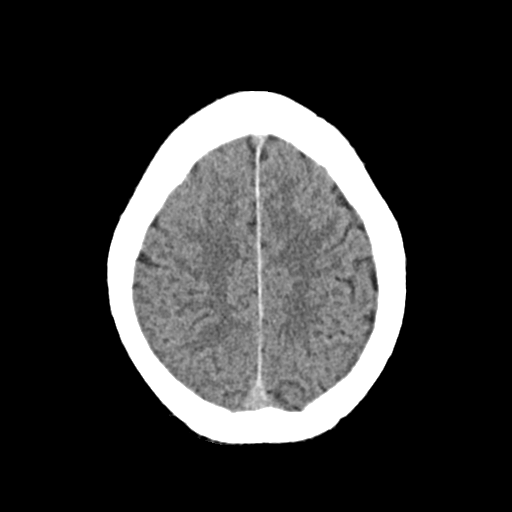
[im 26/32  brain]
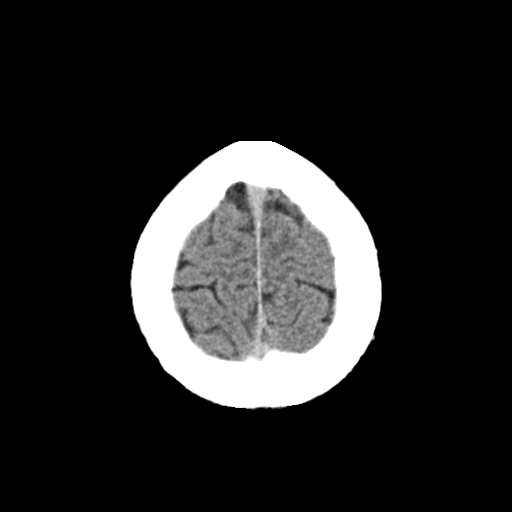
[im 29/32  brain]
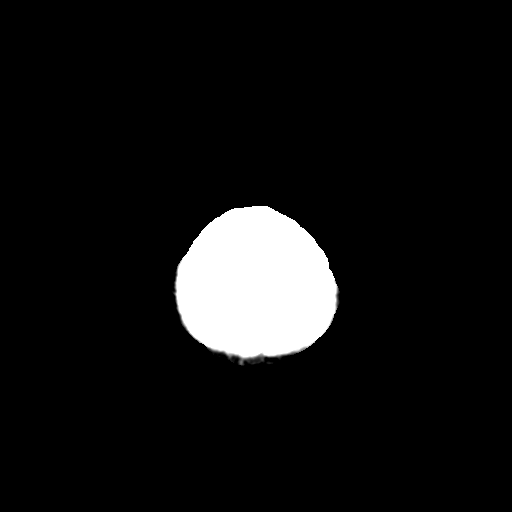
[im 29/32  bone]
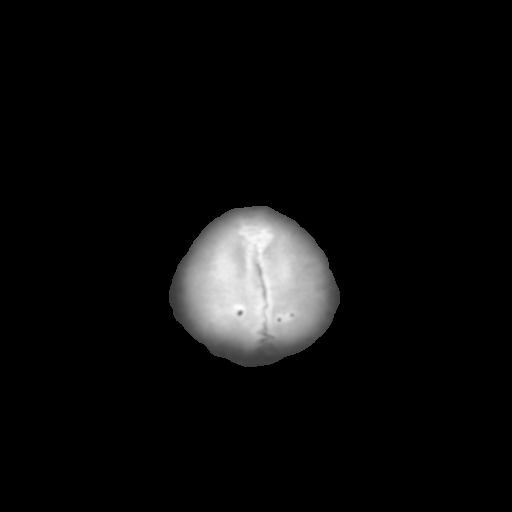

[Series 4: coronal soft · coronal · 0.32mm/px · 3 of 70 slices shown]
[im 24/70  brain]
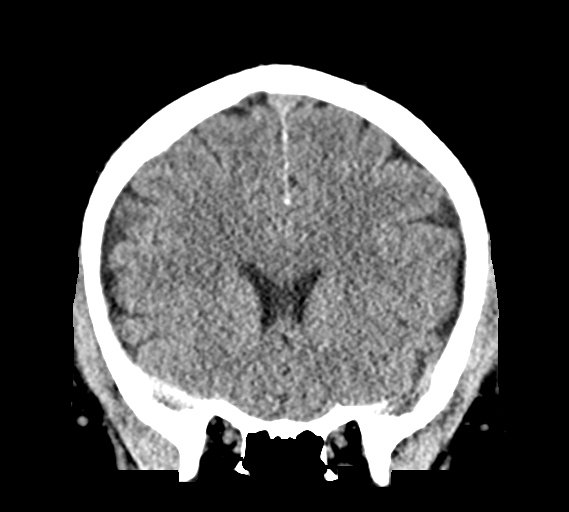
[im 31/70  brain]
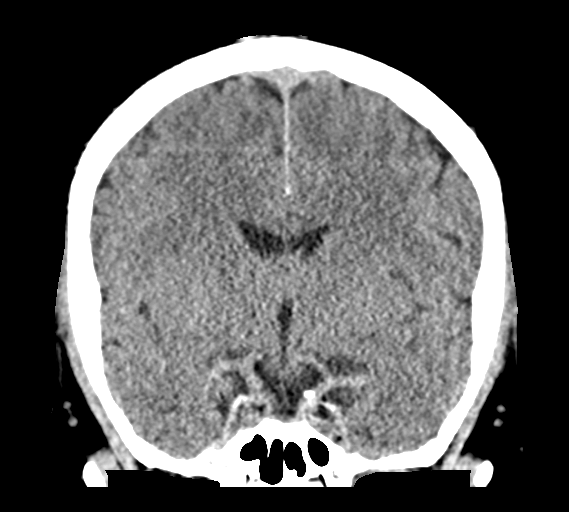
[im 39/70  brain]
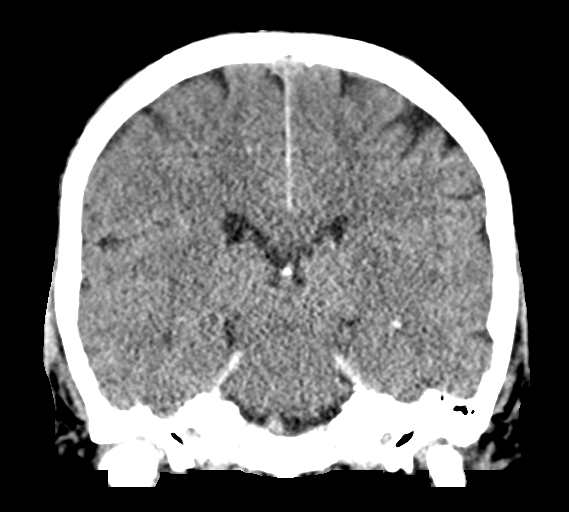

[Series 5: sag soft · sagittal · 0.33mm/px · 3 of 57 slices shown]
[im 19/57  brain]
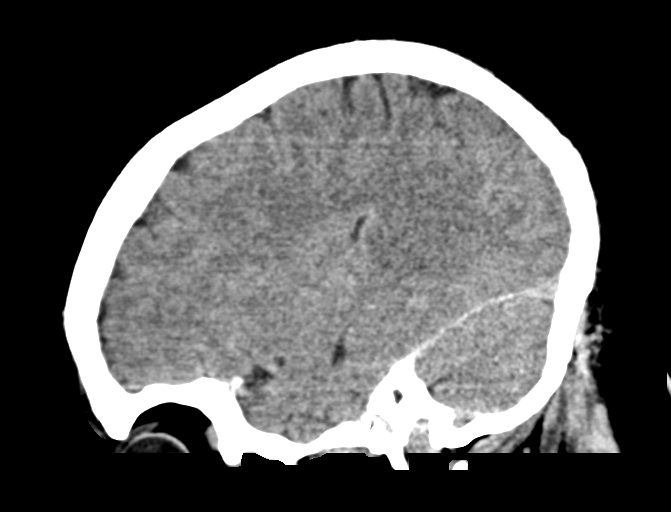
[im 29/57  brain]
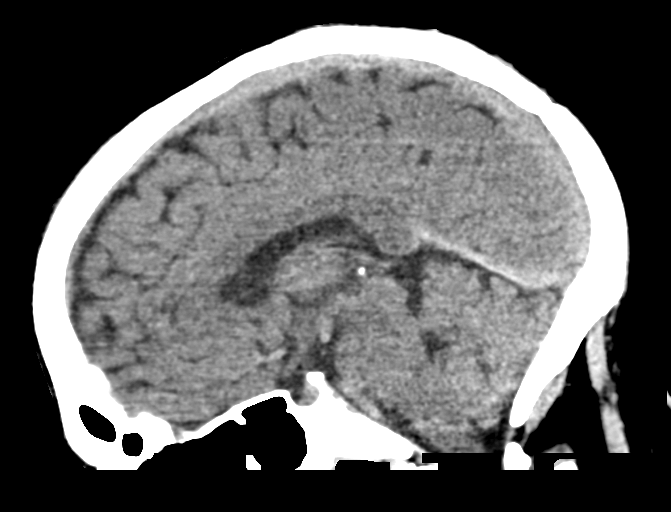
[im 38/57  brain]
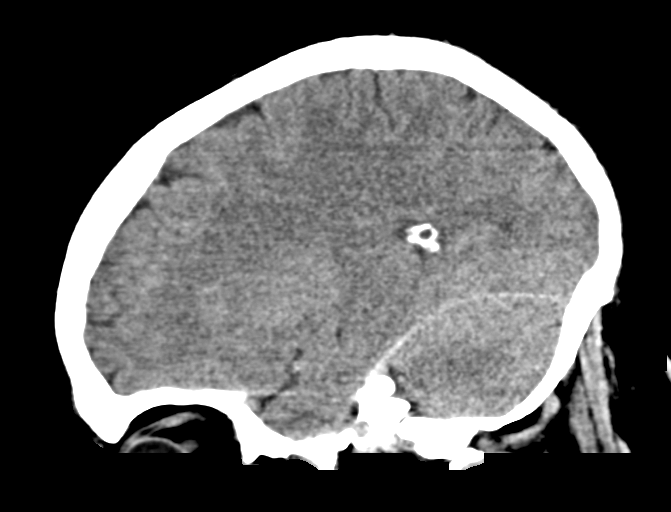

[15 of 47 positions shown; findings below may reference images not displayed]

FINDINGS: Brain: Ventricular system normal in size and appearance for age. No
mass lesion. No midline shift. No acute hemorrhage or hematoma. No
extra-axial fluid collections. No evidence of acute infarction. No
focal brain parenchymal abnormalities.

Vascular: No hyperdense vessel.  No visible atherosclerosis.

Skull: No skull fracture or other focal osseous abnormality
involving the skull.

Sinuses/Orbits: Visualized paranasal sinuses, bilateral mastoid air
cells and bilateral middle ear cavities well-aerated. Visualized
orbits and globes normal in appearance.

Other: None.
IMPRESSION: Normal examination.

## 2020-04-03 MED FILL — LOSARTAN POTASSIUM 100 MG T: 100 | 30 days supply | Qty: 30 | Fill #1

## 2020-05-01 ENCOUNTER — Other Ambulatory Visit: Payer: Self-pay | Admitting: Medical

## 2020-05-01 MED FILL — LOSARTAN POTASSIUM 100 MG T: 100 | 90 days supply | Qty: 90 | Fill #0

## 2020-06-20 ENCOUNTER — Encounter: Payer: Self-pay | Admitting: Medical

## 2020-08-01 MED FILL — LOSARTAN POTASSIUM 100 MG T: 100 | 30 days supply | Qty: 30 | Fill #1

## 2020-09-01 ENCOUNTER — Other Ambulatory Visit: Payer: Self-pay | Admitting: Medical

## 2020-09-01 MED FILL — LOSARTAN POTASSIUM 100 MG T: 100 | 30 days supply | Qty: 30 | Fill #0

## 2020-10-05 ENCOUNTER — Encounter: Payer: Self-pay | Admitting: Medical

## 2020-10-05 NOTE — Telephone Encounter (Signed)
Is it okay for TOC ?

## 2020-10-06 ENCOUNTER — Other Ambulatory Visit (HOSPITAL_BASED_OUTPATIENT_CLINIC_OR_DEPARTMENT_OTHER): Payer: Self-pay

## 2020-10-06 MED FILL — Losartan Potassium Tab 100 MG: ORAL | 90 days supply | Qty: 90 | Fill #0 | Status: AC

## 2020-10-25 ENCOUNTER — Other Ambulatory Visit: Payer: Self-pay

## 2020-10-25 ENCOUNTER — Other Ambulatory Visit (HOSPITAL_BASED_OUTPATIENT_CLINIC_OR_DEPARTMENT_OTHER): Payer: Self-pay

## 2020-10-25 ENCOUNTER — Encounter: Payer: Self-pay | Admitting: Family Medicine

## 2020-10-25 ENCOUNTER — Ambulatory Visit (INDEPENDENT_AMBULATORY_CARE_PROVIDER_SITE_OTHER): Payer: No Typology Code available for payment source | Admitting: Family Medicine

## 2020-10-25 VITALS — BP 139/74 | HR 78 | Temp 97.8°F | Resp 18 | Ht 64.0 in | Wt 258.0 lb

## 2020-10-25 DIAGNOSIS — Z1159 Encounter for screening for other viral diseases: Secondary | ICD-10-CM | POA: Diagnosis not present

## 2020-10-25 DIAGNOSIS — Z0000 Encounter for general adult medical examination without abnormal findings: Secondary | ICD-10-CM

## 2020-10-25 DIAGNOSIS — N529 Male erectile dysfunction, unspecified: Secondary | ICD-10-CM

## 2020-10-25 DIAGNOSIS — Z1211 Encounter for screening for malignant neoplasm of colon: Secondary | ICD-10-CM

## 2020-10-25 DIAGNOSIS — L304 Erythema intertrigo: Secondary | ICD-10-CM | POA: Diagnosis not present

## 2020-10-25 LAB — COMPREHENSIVE METABOLIC PANEL
ALT: 42 U/L (ref 0–53)
AST: 25 U/L (ref 0–37)
Albumin: 4.3 g/dL (ref 3.5–5.2)
Alkaline Phosphatase: 67 U/L (ref 39–117)
BUN: 23 mg/dL (ref 6–23)
CO2: 30 mEq/L (ref 19–32)
Calcium: 9.2 mg/dL (ref 8.4–10.5)
Chloride: 103 mEq/L (ref 96–112)
Creatinine, Ser: 1.16 mg/dL (ref 0.40–1.50)
GFR: 71.84 mL/min (ref >60.00)
Glucose, Bld: 124 mg/dL — ABNORMAL HIGH (ref 70–99)
Potassium: 4.5 mEq/L (ref 3.5–5.1)
Sodium: 141 mEq/L (ref 135–145)
Total Bilirubin: 0.6 mg/dL (ref 0.2–1.2)
Total Protein: 6.8 g/dL (ref 6.0–8.3)

## 2020-10-25 LAB — LIPID PANEL
Cholesterol: 234 mg/dL — ABNORMAL HIGH (ref 0–200)
HDL: 57.3 mg/dL (ref >39.00)
LDL Cholesterol: 153 mg/dL — ABNORMAL HIGH (ref 0–99)
NonHDL: 176.82
Total CHOL/HDL Ratio: 4
Triglycerides: 117 mg/dL (ref 0.0–149.0)
VLDL: 23.4 mg/dL (ref 0.0–40.0)

## 2020-10-25 LAB — CBC
HCT: 45.1 % (ref 39.0–52.0)
Hemoglobin: 14.9 g/dL (ref 13.0–17.0)
MCHC: 33 g/dL (ref 30.0–36.0)
MCV: 89.2 fl (ref 78.0–100.0)
Platelets: 222 10*3/uL (ref 150.0–400.0)
RBC: 5.06 Mil/uL (ref 4.22–5.81)
RDW: 13.9 % (ref 11.5–15.5)
WBC: 4.3 10*3/uL (ref 4.0–10.5)

## 2020-10-25 MED ORDER — SILDENAFIL CITRATE 100 MG PO TABS: 100.0000 mg | ORAL_TABLET | Freq: Every day | ORAL | 2 refills | Status: DC | PRN

## 2020-10-25 MED ORDER — NYSTATIN 100000 UNIT/GM EX CREA
1.0000 "application " | TOPICAL_CREAM | Freq: Two times a day (BID) | CUTANEOUS | 2 refills | Status: DC
  Filled 2020-10-25: qty 30, 15d supply, fill #0

## 2020-10-25 NOTE — Patient Instructions (Addendum)
As long as your diet is deficient in veggies, I would take a multivitamin.   Consider taking 1000 units of vitamin D3 daily.  Give [place] 2-3 business days to get the results of your labs back.   Keep the diet clean and stay active.  If you do not hear anything about your referral in the next 1-2 weeks, call our office and ask for an update.  Use GoodRx for the Viagra.   Let us know if you need anything.

## 2020-10-25 NOTE — Progress Notes (Signed)
Chief Complaint  Patient presents with  . Transitions Of Care    Well Male Nathan Brandt is here for a complete physical.   His last physical was >1 year ago.  Current diet: in general, a "terrible" diet.  Current exercise: active in work Weight trend: increased in the AM  Fatigue out of ordinary? Yes. Seat belt? Yes.    Health maintenance Shingrix- No Colonoscopy- No Tetanus- Yes HIV- Yes Hep C- No   Several mo of a red area under R armpit. No pain or itching. Comes and goes. He works in [employer] and sweats a lot at work. Has not tried anything thus far.  Has issues with ED. Several mo. Desire for intercourse is present, sometimes has issues both attaining and maintaining erection sufficient for penetration. Has never tried anything.    Past Medical History:  Diagnosis Date  . 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 Hypertension       Past Surgical History:  Procedure Laterality Date  . KIDNEY SURGERY    . KIDNEY SURGERY     left side surgery. 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    Medications  Current Outpatient Medications on File Prior to Visit  Medication Sig Dispense Refill  . losartan (COZAAR) 100 MG tablet Take 1 tablet (100 mg total) by mouth daily. 30 tablet 0    Allergies Allergies  Allergen Reactions  . Prednisone Anaphylaxis    uvula swollen    Family History Family History  Problem Relation Age of Onset  . Cancer Mother   . Diabetes Father     Review of Systems: Constitutional:  no fevers Eye:  no recent significant change in vision Ear/Nose/Mouth/Throat:  Ears:  no hearing loss Nose/Mouth/Throat:  no complaints of nasal congestion, no sore throat Cardiovascular:  no chest pain Respiratory:  no shortness of breath Gastrointestinal:  no change in bowel habits GU:  Male: negative for dysuria, frequency Musculoskeletal/Extremities:  no joint pain Integumentary (Skin/Breast): +area under armpit of redness;  otherwise no abnormal skin lesions reported Neurologic:  no headaches Endocrine: No unexpected weight changes Hematologic/Lymphatic:  no abnormal bleeding  Exam BP 139/74   Pulse 78   Temp 97.8 F (36.6 C)   Resp 18   Ht 5\' 4"  (1.626 m)   Wt 258 lb (117 kg)   SpO2 94%   BMI 44.29 kg/m  General:  well developed, well nourished, in no apparent distress Skin: Uniformly erythematous patch of skin in R axillae; otherwise no significant moles, warts, or growths Head:  no masses, lesions, or tenderness Eyes:  pupils equal and round, sclera anicteric without injection Ears:  canals without lesions, TMs shiny without retraction, no obvious effusion, no erythema Nose:  nares patent, septum midline, mucosa normal Throat/Pharynx:  lips and gingiva without lesion; tongue and uvula midline; non-inflamed pharynx; no exudates or postnasal drainage Neck: neck supple without adenopathy, thyromegaly, or masses Cardiac: RRR, no bruits, no LE edema Lungs:  clear to auscultation, breath sounds equal bilaterally, no respiratory distress Abdomen: BS+, soft, non-tender, non-distended, no masses or organomegaly noted Rectal: Deferred Musculoskeletal:  symmetrical muscle groups noted without atrophy or deformity Neuro:  gait normal; deep tendon reflexes normal and symmetric Psych: well oriented with normal range of affect and appropriate judgment/insight  Assessment and Plan  Well adult exam - Plan: Comprehensive metabolic panel, Lipid panel, CBC  Screen for colon cancer - Plan: Ambulatory referral to Gastroenterology  Encounter for hepatitis C screening test for low risk patient -  Plan: Hepatitis C antibody  Intertrigo - Plan: nystatin cream (MYCOSTATIN)  Erectile dysfunction, unspecified erectile dysfunction type - Plan: sildenafil (VIAGRA) 100 MG tablet   Well 55 y.o. male. Counseled on diet and exercise. Counseled on risks and benefits of prostate cancer screening with PSA. The patient agrees  to forego testing. Immunizations, labs, and further orders as above. Discussed covid vaccine, declined. Nystatin cream bid for intertrigo. ED chronic, uncontrolled. Trial Viagra, use Goodrx.  Follow up in 6 mo or prn. The patient voiced understanding and agreement to the plan.  Jilda Roche [place], DO 10/25/20 7:52 AM

## 2020-10-26 ENCOUNTER — Other Ambulatory Visit (INDEPENDENT_AMBULATORY_CARE_PROVIDER_SITE_OTHER): Payer: No Typology Code available for payment source

## 2020-10-26 DIAGNOSIS — R739 Hyperglycemia, unspecified: Secondary | ICD-10-CM

## 2020-10-26 DIAGNOSIS — E669 Obesity, unspecified: Secondary | ICD-10-CM

## 2020-10-26 DIAGNOSIS — E1169 Type 2 diabetes mellitus with other specified complication: Secondary | ICD-10-CM | POA: Insufficient documentation

## 2020-10-26 LAB — HEPATITIS C ANTIBODY
Hepatitis C Ab: NONREACTIVE
SIGNAL TO CUT-OFF: 0.01 (ref <1.00)

## 2020-10-26 LAB — HEMOGLOBIN A1C: Hgb A1c MFr Bld: 6.6 % — ABNORMAL HIGH (ref 4.6–6.5)

## 2020-10-26 NOTE — Progress Notes (Signed)
la 

## 2021-01-08 ENCOUNTER — Other Ambulatory Visit (HOSPITAL_BASED_OUTPATIENT_CLINIC_OR_DEPARTMENT_OTHER): Payer: Self-pay

## 2021-01-08 MED ORDER — LOSARTAN POTASSIUM 100 MG PO TABS
100.0000 mg | ORAL_TABLET | Freq: Every day | ORAL | 1 refills | Status: DC
  Filled 2021-01-08: qty 90, 90d supply, fill #0
  Filled 2021-04-09: qty 90, 90d supply, fill #1

## 2021-01-12 ENCOUNTER — Other Ambulatory Visit: Payer: Self-pay

## 2021-01-12 ENCOUNTER — Other Ambulatory Visit (HOSPITAL_BASED_OUTPATIENT_CLINIC_OR_DEPARTMENT_OTHER): Payer: Self-pay

## 2021-01-12 ENCOUNTER — Ambulatory Visit (AMBULATORY_SURGERY_CENTER): Payer: Self-pay

## 2021-01-12 VITALS — Ht 64.0 in | Wt 262.0 lb

## 2021-01-12 DIAGNOSIS — Z1211 Encounter for screening for malignant neoplasm of colon: Secondary | ICD-10-CM

## 2021-01-12 MED ORDER — NA SULFATE-K SULFATE-MG SULF 17.5-3.13-1.6 GM/177ML PO SOLN
1.0000 | Freq: Once | ORAL | 0 refills | Status: AC
  Filled 2021-01-12: qty 354, 2d supply, fill #0

## 2021-01-12 NOTE — Progress Notes (Signed)
No egg or soy allergy known to patient  No issues with past sedation with any surgeries or procedures Patient denies ever being told they had issues or difficulty with intubation  No FH of Malignant Hyperthermia No diet pills per patient No home 02 use per patient  No blood thinners per patient  Pt denies issues with constipation  No A fib or A flutter  EMMI video via MyChart  COVID 19 guidelines implemented in PV today with Pt and RN   NO PA's for preps discussed with pt in PV today  Discussed with pt there will be an out-of-pocket cost for prep and that varies from $0 to 70 dollars   Due to the COVID-19 pandemic we are asking patients to follow certain guidelines.  Pt aware of COVID protocols and [facility] guidelines   

## 2021-01-26 ENCOUNTER — Encounter: Payer: Self-pay | Admitting: Gastroenterology

## 2021-01-26 ENCOUNTER — Other Ambulatory Visit: Payer: Self-pay

## 2021-01-26 ENCOUNTER — Ambulatory Visit (AMBULATORY_SURGERY_CENTER): Payer: No Typology Code available for payment source | Admitting: Gastroenterology

## 2021-01-26 VITALS — BP 135/69 | HR 68 | Temp 97.5°F | Resp 13 | Ht 64.0 in | Wt 262.0 lb

## 2021-01-26 DIAGNOSIS — Z1211 Encounter for screening for malignant neoplasm of colon: Secondary | ICD-10-CM | POA: Diagnosis present

## 2021-01-26 MED ORDER — SODIUM CHLORIDE 0.9 % IV SOLN: 500.0000 mL | Freq: Once | INTRAVENOUS | Status: DC

## 2021-01-26 NOTE — Patient Instructions (Signed)
YOU HAD AN ENDOSCOPIC PROCEDURE TODAY AT THE [facility]:   Refer to the procedure report that was given to you for any specific questions about what was found during the examination.  If the procedure report does not answer your questions, please call your gastroenterologist to clarify.  If you requested that your care partner not be given the details of your procedure findings, then the procedure report has been included in a sealed envelope for you to review at your convenience later. ° °YOU SHOULD EXPECT: Some feelings of bloating in the abdomen. Passage of more gas than usual.  Walking can help get rid of the air that was put into your GI tract during the procedure and reduce the bloating. If you had a lower endoscopy (such as a colonoscopy or flexible sigmoidoscopy) you may notice spotting of blood in your stool or on the toilet paper. If you underwent a bowel prep for your procedure, you may not have a normal bowel movement for a few days. ° °Please Note:  You might notice some irritation and congestion in your nose or some drainage.  This is from the oxygen used during your procedure.  There is no need for concern and it should clear up in a day or so. ° °SYMPTOMS TO REPORT IMMEDIATELY: ° °Following lower endoscopy (colonoscopy or flexible sigmoidoscopy): ° Excessive amounts of blood in the stool ° Significant tenderness or worsening of abdominal pains ° Swelling of the abdomen that is new, acute ° Fever of 100°F or higher ° °For urgent or emergent issues, a gastroenterologist can be reached at any hour by calling [phone]. °Do not use MyChart messaging for urgent concerns.  ° ° °DIET:  We do recommend a small meal at first, but then you may proceed to your regular diet.  Drink plenty of fluids but you should avoid alcoholic beverages for 24 hours. ° °ACTIVITY:  You should plan to take it easy for the rest of today and you should NOT DRIVE or use heavy machinery until tomorrow (because of  the sedation medicines used during the test).   ° °FOLLOW UP: °Our staff will call the number listed on your records 48-72 hours following your procedure to check on you and address any questions or concerns that you may have regarding the information given to you following your procedure. If we do not reach you, we will leave a message.  We will attempt to reach you two times.  During this call, we will ask if you have developed any symptoms of COVID 19. If you develop any symptoms (ie: fever, flu-like symptoms, shortness of breath, cough etc.) before then, please call [phone].  If you test positive for Covid 19 in the 2 weeks post procedure, please call and report this information to us.   ° °SIGNATURES/CONFIDENTIALITY: °You and/or your care partner have signed paperwork which will be entered into your electronic medical record.  These signatures attest to the fact that that the information above on your After Visit Summary has been reviewed and is understood.  Full responsibility of the confidentiality of this discharge information lies with you and/or your care-partner.  °

## 2021-01-26 NOTE — Progress Notes (Signed)
Vs by [place]. 

## 2021-01-26 NOTE — Op Note (Signed)
[facility] Patient Name: Nathan Brandt Procedure Date: 01/26/2021 9:01 AM MRN: [id number] Endoscopist: Lynann Bologna , MD Age: 55 Referring MD:  Date of Birth: 1965-11-16 Gender: Male Account #: [id number] Procedure:                Colonoscopy Indications:              Screening for colorectal malignant neoplasm Medicines:                Monitored Anesthesia Care Procedure:                Pre-Anesthesia Assessment:                           - Prior to the procedure, a History and Physical                            was performed, and patient medications and                            allergies were reviewed. The patient's tolerance of                            previous anesthesia was also reviewed. The risks                            and benefits of the procedure and the sedation                            options and risks were discussed with the patient.                            All questions were answered, and informed consent                            was obtained. Prior Anticoagulants: The patient has                            taken no previous anticoagulant or antiplatelet                            agents. ASA Grade Assessment: II - A patient with                            mild systemic disease. After reviewing the risks                            and benefits, the patient was deemed in                            satisfactory condition to undergo the procedure.                           After obtaining informed consent, the colonoscope  was passed under direct vision. Throughout the                            procedure, the patient's blood pressure, pulse, and                            oxygen saturations were monitored continuously. The                            Olympus CF-HQ190L (Serial# [id number]) Colonoscope was                            introduced through the anus and advanced to the the                            cecum, identified  by appendiceal orifice and                            ileocecal valve. The colonoscopy was performed                            without difficulty. The patient tolerated the                            procedure well. The quality of the bowel                            preparation was good. The ileocecal valve,                            appendiceal orifice, and rectum were photographed. Scope In: 9:07:50 AM Scope Out: 9:19:46 AM Scope Withdrawal Time: 0 hours 8 minutes 53 seconds  Total Procedure Duration: 0 hours 11 minutes 56 seconds  Findings:                 Multiple medium-mouthed diverticula were found in                            the sigmoid colon, few scattered in descending                            colon and ascending colon.                           Non-bleeding internal hemorrhoids were found during                            retroflexion. The hemorrhoids were small and Grade                            I (internal hemorrhoids that do not prolapse).                           The exam was otherwise without abnormality on  direct and retroflexion views. Complications:            No immediate complications. Estimated Blood Loss:     Estimated blood loss: none. Impression:               - Pancolonic diverticulosis predominantly in the                            sigmoid colon.                           - Non-bleeding internal hemorrhoids.                           - The examination was otherwise normal on direct                            and retroflexion views.                           - No specimens collected. Recommendation:           - Patient has a contact number available for                            emergencies. The signs and symptoms of potential                            delayed complications were discussed with the                            patient. Return to normal activities tomorrow.                            Written discharge  instructions were provided to the                            patient.                           - High fiber diet.                           - Continue present medications.                           - Repeat colonoscopy in 10 years for screening                            purposes. Earlier if with any new problems or                            change in family history.                           - The findings and recommendations were discussed  with the patient's family. Lynann Bologna, MD 01/26/2021 9:23:40 AM This report has been signed electronically.

## 2021-01-26 NOTE — Progress Notes (Signed)
A and O x3. Report to RN. Tolerated MAC anesthesia well. 

## 2021-01-26 NOTE — Progress Notes (Signed)
[facility] History and Physical   Primary Care Physician:  Sharlene Dory, DO   Reason for Procedure:   Colorectal cancer screening  Plan:     colonoscopy     HPI: Nathan Brandt is a 55 y.o. 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    Prior to Admission medications   Medication Sig Start Date End Date Taking? Authorizing Provider  losartan (COZAAR) 100 MG tablet Take 1 tablet (100 mg total) by mouth daily. 01/08/21  Yes Sharlene Dory, DO  nystatin cream (MYCOSTATIN) Apply 1 application topically 2 (two) times daily. 10/25/20  Yes Sharlene Dory, DO  sildenafil (VIAGRA) 100 MG tablet Take 1 tablet (100 mg total) by mouth daily as needed for erectile dysfunction. 10/25/20   Sharlene Dory, DO    Current Outpatient Medications  Medication Sig Dispense Refill   losartan (COZAAR) 100 MG tablet Take 1 tablet (100 mg total) by mouth daily. 90 tablet 1   nystatin cream (MYCOSTATIN) Apply 1 application topically 2 (two) times daily. 30 g 2   sildenafil (VIAGRA) 100 MG tablet Take 1 tablet (100 mg total) by mouth daily as needed for erectile dysfunction. 30 tablet 2   Current Facility-Administered Medications  Medication Dose Route Frequency Provider Last Rate Last Admin   0.9 %  sodium chloride infusion  500 mL Intravenous Once Lynann Bologna, MD        Allergies as of 01/26/2021 - Review Complete 01/26/2021  Allergen Reaction Noted   Prednisone Anaphylaxis 05/25/2018    Family History  Problem Relation Age of  Onset   Cancer Mother    Diabetes Father    Colon cancer Neg Hx    Colon polyps Neg Hx    Esophageal cancer Neg Hx    Stomach cancer Neg Hx    Rectal cancer Neg Hx     Social History   Socioeconomic History   Marital status: Married    Spouse name: Not on file   Number of children: Not on file   Years of education: Not on file   Highest education level: Not on file  Occupational History   Not on file  Tobacco Use   Smoking status: Never   Smokeless tobacco: Never  Vaping Use   Vaping Use: Never used  Substance and Sexual Activity   Alcohol use: Yes    Comment: 2 glasses of wine every 3 months.   Drug use: Never   Sexual activity: Yes  Other Topics Concern   Not on file  Social History Narrative   Not on file   Social Determinants of Health   Financial Resource Strain: Not on file  Food Insecurity: Not on file  Transportation Needs: Not on file  Physical Activity: Not on file  Stress: Not on file  Social Connections: Not on file  Intimate Partner Violence: Not on file    Review of Systems: Positive for no new All other review of systems negative except as mentioned  in the HPI.  Physical Exam: Vital signs in last 24 hours: @VSRANGES @   General:   Alert,  Well-developed, well-nourished, pleasant and cooperative in NAD Lungs:  Clear throughout to auscultation.   Heart:  Regular rate and rhythm; no murmurs, clicks, rubs,  or gallops. Abdomen:  Soft, nontender and nondistended. Normal bowel sounds.   Neuro/Psych:  Alert and cooperative. Normal mood and affect. A and O x 3    No significant changes were identified.  The patient continues to be an appropriate candidate for the planned procedure and anesthesia.   , MD. [facility] 01/26/2021 9:00 AM@

## 2021-01-30 ENCOUNTER — Telehealth: Payer: Self-pay | Admitting: *Deleted

## 2021-01-30 NOTE — Telephone Encounter (Signed)
  Follow up Call-  Call back number 01/26/2021  Post procedure Call Back phone  # [phone]  Permission to leave phone message Yes  Some recent data might be hidden     Patient questions:  Do you have a fever, pain , or abdominal swelling? No. Pain Score  0 *  Have you tolerated food without any problems? Yes.    Have you been able to return to your normal activities? Yes.    Do you have any questions about your discharge instructions: Diet   No. Medications  No. Follow up visit  No.  Do you have questions or concerns about your Care? No.  Actions: * If pain score is 4 or above: No action needed, pain <4.  Have you developed a fever since your procedure? no  2.   Have you had an respiratory symptoms (SOB or cough) since your procedure? no  3.   Have you tested positive for COVID 19 since your procedure no  4.   Have you had any family members/close contacts diagnosed with the COVID 19 since your procedure?  no   If yes to any of these questions please route to Laverna Peace, RN and Karlton Lemon, RN

## 2021-01-30 NOTE — Telephone Encounter (Signed)
Left message on f/u call 

## 2021-04-10 ENCOUNTER — Other Ambulatory Visit (HOSPITAL_BASED_OUTPATIENT_CLINIC_OR_DEPARTMENT_OTHER): Payer: Self-pay

## 2021-06-05 ENCOUNTER — Other Ambulatory Visit: Payer: Self-pay

## 2021-06-05 ENCOUNTER — Emergency Department
Admission: EM | Admit: 2021-06-05 | Discharge: 2021-06-05 | Disposition: A | Discharge status: Home / Self Care | Payer: No Typology Code available for payment source | Source: Home / Self Care | Attending: Family Medicine | Admitting: Family Medicine

## 2021-06-05 ENCOUNTER — Other Ambulatory Visit (HOSPITAL_BASED_OUTPATIENT_CLINIC_OR_DEPARTMENT_OTHER): Payer: Self-pay

## 2021-06-05 DIAGNOSIS — K0889 Other specified disorders of teeth and supporting structures: Secondary | ICD-10-CM | POA: Diagnosis not present

## 2021-06-05 MED ORDER — AMOXICILLIN-POT CLAVULANATE 875-125 MG PO TABS
1.0000 | ORAL_TABLET | Freq: Two times a day (BID) | ORAL | 0 refills | Status: AC
  Filled 2021-06-05: qty 20, 10d supply, fill #0

## 2021-06-05 NOTE — ED Triage Notes (Signed)
Pt presents with dental pain x 1 week.  ° ° °

## 2021-06-05 NOTE — Discharge Instructions (Addendum)
Advised patient to take medication as directed with food to completion.  Encouraged patient increase daily water intake while taking this medication. 

## 2021-06-05 NOTE — ED Provider Notes (Signed)
[facility]    CSN: [id number] Arrival date & time: 06/05/21  1359      History   Chief Complaint Chief Complaint  Patient presents with   Dental Pain    HPI Nathan Brandt is a 55 y.o. male.   HPI 55 year old male presents with dental pain for 1 week.  PMH significant for HTN and T2DM.  Reports is scheduled to see his dentist on 06/12/2021.  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       Home Medications    Prior to Admission medications   Medication Sig Start Date End Date Taking? Authorizing Provider  amoxicillin-clavulanate (AUGMENTIN) 875-125 MG tablet Take 1 tablet by mouth every 12 (twelve) hours for 10 days. 06/05/21 06/15/21 Yes Trevor Iha, FNP  losartan (COZAAR) 100 MG tablet Take 1 tablet (100 mg total) by mouth daily. 01/08/21   Sharlene Dory, DO  nystatin cream (MYCOSTATIN) Apply 1 application topically 2 (two) times daily. Patient not taking: Reported on 06/05/2021 10/25/20   Sharlene Dory, DO  sildenafil (VIAGRA) 100 MG tablet Take 1 tablet (100 mg total) by mouth daily as needed for erectile dysfunction. Patient not taking: Reported on 06/05/2021 10/25/20   Sharlene Dory, DO    Family History Family History  Problem Relation Age of Onset   Cancer Mother    Diabetes Father     Colon cancer Neg Hx    Colon polyps Neg Hx    Esophageal cancer Neg Hx    Stomach cancer Neg Hx    Rectal cancer Neg Hx     Social History Social History   Tobacco Use   Smoking status: Never   Smokeless tobacco: Never  Vaping Use   Vaping Use: Never used  Substance Use Topics   Alcohol use: Yes    Comment: 2 glasses of wine every 3 months.   Drug use: Never     Allergies   Prednisone   Review of Systems Review of Systems  HENT:  Positive for dental problem.   All other systems reviewed and are negative.   Physical Exam Triage Vital Signs ED Triage Vitals  Enc Vitals Group     BP 06/05/21 1443 (!) 164/93     Pulse Rate 06/05/21 1443 88     Resp 06/05/21 1443 16     Temp 06/05/21 1443 98.7 F (37.1 C)     Temp Source 06/05/21 1443 Oral     SpO2 06/05/21 1443 96 %     Weight --      Height --      Head Circumference --      Peak Flow --      Pain Score 06/05/21 1445 5  Pain Loc --      Pain Edu? --      Excl. in GC? --    No data found.  Updated Vital Signs BP (!) 164/93 (BP Location: Left Arm)    Pulse 88    Temp 98.7 F (37.1 C) (Oral)    Resp 16    SpO2 96%     Physical Exam Vitals and nursing note reviewed.  Constitutional:      General: He is not in acute distress.    Appearance: He is obese. He is not ill-appearing.  HENT:     Head: Normocephalic and atraumatic.     Nose: Nose normal.     Mouth/Throat:     Mouth: Mucous membranes are moist.     Pharynx: Oropharynx is clear.      Comments: Significant dental caries noted, of the left lower premolar, below gingival line Eyes:     Extraocular Movements: Extraocular movements intact.     Conjunctiva/sclera: Conjunctivae normal.     Pupils: Pupils are equal, round, and reactive to light.  Cardiovascular:     Rate and Rhythm: Normal rate and regular rhythm.     Pulses: Normal pulses.     Heart sounds: Normal heart sounds.  Pulmonary:     Effort: Pulmonary effort is normal.      Breath sounds: Normal breath sounds.  Musculoskeletal:     Cervical back: Normal range of motion and neck supple.  Skin:    General: Skin is warm and dry.  Neurological:     General: No focal deficit present.     Mental Status: He is alert and oriented to person, place, and time.     UC Treatments / Results  Labs (all labs ordered are listed, but only abnormal results are displayed) Labs Reviewed - No data to display  EKG   Radiology No results found.  Procedures Procedures (including critical care time)  Medications Ordered in UC Medications - No data to display  Initial Impression / Assessment and Plan / UC Course  I have reviewed the triage vital signs and the nursing notes.  Pertinent labs & imaging results that were available during my care of the patient were reviewed by me and considered in my medical decision making (see chart for details).     MDM: 1.  Dental pain-Rx'd Augmentin. Advised patient to take medication as directed with food to completion.  Encouraged patient to increase daily water intake while taking this medication. Final Clinical Impressions(s) / UC Diagnoses   Final diagnoses:  Pain, dental     Discharge Instructions      Advised patient to take medication as directed with food to completion.  Encouraged patient increase daily water intake while taking this medication.     ED Prescriptions     Medication Sig Dispense Auth. Provider   amoxicillin-clavulanate (AUGMENTIN) 875-125 MG tablet Take 1 tablet by mouth every 12 (twelve) hours for 10 days. 20 tablet Trevor Iha, FNP      I have reviewed the PDMP during this encounter.   Trevor Iha, FNP 06/05/21 1614

## 2021-07-16 ENCOUNTER — Other Ambulatory Visit: Payer: Self-pay | Admitting: Family Medicine

## 2021-07-16 ENCOUNTER — Other Ambulatory Visit (HOSPITAL_BASED_OUTPATIENT_CLINIC_OR_DEPARTMENT_OTHER): Payer: Self-pay

## 2021-07-16 MED ORDER — LOSARTAN POTASSIUM 100 MG PO TABS
100.0000 mg | ORAL_TABLET | Freq: Every day | ORAL | 1 refills | Status: DC
  Filled 2021-07-16: qty 90, 90d supply, fill #0
  Filled 2021-10-22: qty 90, 90d supply, fill #1

## 2021-10-15 ENCOUNTER — Other Ambulatory Visit (HOSPITAL_BASED_OUTPATIENT_CLINIC_OR_DEPARTMENT_OTHER): Payer: Self-pay

## 2021-10-15 ENCOUNTER — Encounter: Payer: Self-pay | Admitting: Family Medicine

## 2021-10-15 ENCOUNTER — Ambulatory Visit (INDEPENDENT_AMBULATORY_CARE_PROVIDER_SITE_OTHER): Payer: No Typology Code available for payment source | Admitting: Family Medicine

## 2021-10-15 VITALS — BP 132/82 | HR 78 | Temp 97.0°F | Ht 64.0 in | Wt 258.4 lb

## 2021-10-15 DIAGNOSIS — G43109 Migraine with aura, not intractable, without status migrainosus: Secondary | ICD-10-CM

## 2021-10-15 MED ORDER — SUMATRIPTAN SUCCINATE 100 MG PO TABS
100.0000 mg | ORAL_TABLET | ORAL | 2 refills | Status: DC | PRN
Start: 2021-10-15 — End: 2021-12-25
  Filled 2021-10-15: qty 9, 30d supply, fill #0

## 2021-10-15 NOTE — Patient Instructions (Addendum)
Heat (pad or rice pillow in microwave) over affected area, 10-15 minutes twice daily.  ? ?Ice/cold pack over area for 10-15 min twice daily. ? ?OK to take Tylenol 1000 mg (2 extra strength tabs) or 975 mg (3 regular strength tabs) every 6 hours as needed. ? ?Ibuprofen 400-600 mg (2-3 over the counter strength tabs) every 6 hours as needed for pain. ? ?Consider magnesium daily.  ? ?Let us know if you need anything. ? ?EXERCISES ?RANGE OF MOTION (ROM) AND STRETCHING EXERCISES  ?These exercises may help you when beginning to rehabilitate your issue. In order to successfully resolve your symptoms, you must improve your posture. These exercises are designed to help reduce the forward-head and rounded-shoulder posture which contributes to this condition. Your symptoms may resolve with or without further involvement from your physician, physical therapist or athletic trainer. While completing these exercises, remember:  ?Restoring tissue flexibility helps normal motion to return to the joints. This allows healthier, less painful movement and activity. ?An effective stretch should be held for at least 20 seconds, although you may need to begin with shorter hold times for comfort. ?A stretch should never be painful. You should only feel a gentle lengthening or release in the stretched tissue. ?Do not do any stretch or exercise that you cannot tolerate. ? ?STRETCH- Axial Extensors ?Lie on your back on the floor. You may bend your knees for comfort. Place a rolled-up hand towel or dish towel, about 2 inches in diameter, under the part of your head that makes contact with the floor. ?Gently tuck your chin, as if trying to make a "double chin," until you feel a gentle stretch at the base of your head. ?Hold 15-20 seconds. ?Repeat 2-3 times. Complete this exercise 1 time per day.  ? ?STRETCH - Axial Extension  ?Stand or sit on a firm surface. Assume a good posture: chest up, shoulders drawn back, abdominal muscles slightly tense,  knees unlocked (if standing) and feet hip width apart. ?Slowly retract your chin so your head slides back and your chin slightly lowers. Continue to look straight ahead. ?You should feel a gentle stretch in the back of your head. Be certain not to feel an aggressive stretch since this can cause headaches later. ?Hold for 15-20 seconds. ?Repeat 2-3 times. Complete this exercise 1 time per day. ? ?STRETCH - Cervical Side Bend  ?Stand or sit on a firm surface. Assume a good posture: chest up, shoulders drawn back, abdominal muscles slightly tense, knees unlocked (if standing) and feet hip width apart. ?Without letting your nose or shoulders move, slowly tip your right / left ear to your shoulder until your feel a gentle stretch in the muscles on the opposite side of your neck. ?Hold 15-20 seconds. ?Repeat 2-3 times. Complete this exercise 1-2 times per day. ? ?STRETCH - Cervical Rotators  ?Stand or sit on a firm surface. Assume a good posture: chest up, shoulders drawn back, abdominal muscles slightly tense, knees unlocked (if standing) and feet hip width apart. ?Keeping your eyes level with the ground, slowly turn your head until you feel a gentle stretch along the back and opposite side of your neck. ?Hold 15-20 seconds. ?Repeat 2-3 times. Complete this exercise 1-2 times per day. ? ?RANGE OF MOTION - Neck Circles  ?Stand or sit on a firm surface. Assume a good posture: chest up, shoulders drawn back, abdominal muscles slightly tense, knees unlocked (if standing) and feet hip width apart. ?Gently roll your head down and around from  the back of one shoulder to the back of the other. The motion should never be forced or painful. ?Repeat the motion 10-20 times, or until you feel the neck muscles relax and loosen. ?Repeat 2-3 times. Complete the exercise 1-2 times per day. ?STRENGTHENING EXERCISES - Cervical Strain and Sprain ?These exercises may help you when beginning to rehabilitate your injury. They may resolve your  symptoms with or without further involvement from your physician, physical therapist, or athletic trainer. While completing these exercises, remember:  ?Muscles can gain both the endurance and the strength needed for everyday activities through controlled exercises. ?Complete these exercises as instructed by your physician, physical therapist, or athletic trainer. Progress the resistance and repetitions only as guided. ?You may experience muscle soreness or fatigue, but the pain or discomfort you are trying to eliminate should never worsen during these exercises. If this pain does worsen, stop and make certain you are following the directions exactly. If the pain is still present after adjustments, discontinue the exercise until you can discuss the trouble with your clinician. ? ?STRENGTH - Cervical Flexors, Isometric ?Face a wall, standing about 6 inches away. Place a small pillow, a ball about 6-8 inches in diameter, or a folded towel between your forehead and the wall. ?Slightly tuck your chin and gently push your forehead into the soft object. Push only with mild to moderate intensity, building up tension gradually. Keep your jaw and forehead relaxed. ?Hold 10 to 20 seconds. Keep your breathing relaxed. ?Release the tension slowly. Relax your neck muscles completely before you start the next repetition. ?Repeat 2-3 times. Complete this exercise 1 time per day. ? ?STRENGTH- Cervical Lateral Flexors, Isometric  ?Stand about 6 inches away from a wall. Place a small pillow, a ball about 6-8 inches in diameter, or a folded towel between the side of your head and the wall. ?Slightly tuck your chin and gently tilt your head into the soft object. Push only with mild to moderate intensity, building up tension gradually. Keep your jaw and forehead relaxed. ?Hold 10 to 20 seconds. Keep your breathing relaxed. ?Release the tension slowly. Relax your neck muscles completely before you start the next repetition. ?Repeat 2-3  times. Complete this exercise 1 time per day. ? ?STRENGTH - Cervical Extensors, Isometric  ?Stand about 6 inches away from a wall. Place a small pillow, a ball about 6-8 inches in diameter, or a folded towel between the back of your head and the wall. ?Slightly tuck your chin and gently tilt your head back into the soft object. Push only with mild to moderate intensity, building up tension gradually. Keep your jaw and forehead relaxed. ?Hold 10 to 20 seconds. Keep your breathing relaxed. ?Release the tension slowly. Relax your neck muscles completely before you start the next repetition. ?Repeat 2-3 times. Complete this exercise 1 time per day. ? ?POSTURE AND BODY MECHANICS CONSIDERATIONS ?Keeping correct posture when sitting, standing or completing your activities will reduce the stress put on different body tissues, allowing injured tissues a chance to heal and limiting painful experiences. The following are general guidelines for improved posture. Your physician or physical therapist will provide you with any instructions specific to your needs. While reading these guidelines, remember: ?The exercises prescribed by your provider will help you have the flexibility and strength to maintain correct postures. ?The correct posture provides the optimal environment for your joints to work. All of your joints have less wear and tear when properly supported by a spine with good  posture. This means you will experience a healthier, less painful body. ?Correct posture must be practiced with all of your activities, especially prolonged sitting and standing. Correct posture is as important when doing repetitive low-stress activities (typing) as it is when doing a single heavy-load activity (lifting). ? ?PROLONGED STANDING WHILE SLIGHTLY LEANING FORWARD ?When completing a task that requires you to lean forward while standing in one place for a long time, place either foot up on a stationary 2- to 4-inch high object to help  maintain the best posture. When both feet are on the ground, the low back tends to lose its slight inward curve. If this curve flattens (or becomes too large), then the back and your other joints will exp

## 2021-10-15 NOTE — Progress Notes (Signed)
Chief Complaint  ?Patient presents with  ? Headache  ?  More often  ? ? ?Subjective: ?Patient is a 56 y.o. male here for R sided headaches. ? ?Hx of migraines. Had more freq over the past week. No inj. +neck pain. His aura is R sided neck tightness. He gets double vision/light sensitivity. No jaw pain, difficulty w speech, trouble swallowing, weakness, N/V, balance issues. Ibuprofen/Aleve seem to help as well as sleep.  ? ?Past Medical History:  ?Diagnosis Date  ? Adhesive capsulitis of left shoulder 12/23/2018  ? bilateral shoulders  ? Diabetes mellitus type 2 in obese [facility])   ? diet controlled- patient reports he has never been told he had DM- no meds  ? Hyperlipidemia   ? diet controlled  ? Hypertension   ? on meds  ? Migraines   ? ? ?Objective: ?BP 132/82 (BP Location: Left Arm, Cuff Size: Large)   Pulse 78   Temp (!) 97 ?F (36.1 ?C) (Oral)   Ht 5\' 4"  (1.626 m)   Wt 258 lb 6 oz (117.2 kg)   SpO2 97%   BMI 44.35 kg/m?  ?General: Awake, appears stated age ?Heart: RRR ?Neuro: DTRs equal and symmetric throughout, no clonus, no cerebellar signs, gait is normal ?MSK: No TTP in the suboccipital triangle region or cervical paraspinal musculature, TMJ, or temporalis ?Lungs: No accessory muscle use ?Psych: Age appropriate judgment and insight, normal affect and mood ? ?Assessment and Plan: ?Migraine with aura and without status migrainosus, not intractable - Plan: SUMAtriptan (IMITREX) 100 MG tablet ? ?Chronic, not controlled.  Add Imitrex 100 mg daily as needed.  Repeat in 2 hours if no improvement.  Okay to continue ibuprofen as needed.  Could consider daily magnesium if it does become more frequent.  He would prefer to avoid another prescription if possible.  Follow-up in 2 months to recheck this and do his physical. ?The patient voiced understanding and agreement to the plan. ? ? , DO ?10/15/21  ?8:47 AM ? ? ? ? ?

## 2021-10-22 ENCOUNTER — Encounter: Payer: Self-pay | Admitting: Family Medicine

## 2021-10-22 ENCOUNTER — Other Ambulatory Visit (HOSPITAL_BASED_OUTPATIENT_CLINIC_OR_DEPARTMENT_OTHER): Payer: Self-pay

## 2021-10-22 ENCOUNTER — Other Ambulatory Visit: Payer: Self-pay | Admitting: Family Medicine

## 2021-10-22 MED ORDER — LEVOCETIRIZINE DIHYDROCHLORIDE 5 MG PO TABS
5.0000 mg | ORAL_TABLET | Freq: Every evening | ORAL | 1 refills | Status: DC
  Filled 2021-10-22: qty 90, 90d supply, fill #0
  Filled 2022-04-15 – 2022-05-08 (×2): qty 90, 90d supply, fill #1

## 2021-12-25 ENCOUNTER — Ambulatory Visit (INDEPENDENT_AMBULATORY_CARE_PROVIDER_SITE_OTHER): Payer: No Typology Code available for payment source | Admitting: Family Medicine

## 2021-12-25 ENCOUNTER — Encounter: Payer: Self-pay | Admitting: Family Medicine

## 2021-12-25 ENCOUNTER — Other Ambulatory Visit (HOSPITAL_BASED_OUTPATIENT_CLINIC_OR_DEPARTMENT_OTHER): Payer: Self-pay

## 2021-12-25 VITALS — BP 120/80 | HR 71 | Temp 98.0°F | Ht 64.0 in | Wt 262.1 lb

## 2021-12-25 DIAGNOSIS — Z0000 Encounter for general adult medical examination without abnormal findings: Secondary | ICD-10-CM

## 2021-12-25 DIAGNOSIS — G43109 Migraine with aura, not intractable, without status migrainosus: Secondary | ICD-10-CM

## 2021-12-25 DIAGNOSIS — E669 Obesity, unspecified: Secondary | ICD-10-CM | POA: Diagnosis not present

## 2021-12-25 DIAGNOSIS — Z136 Encounter for screening for cardiovascular disorders: Secondary | ICD-10-CM

## 2021-12-25 DIAGNOSIS — B353 Tinea pedis: Secondary | ICD-10-CM

## 2021-12-25 DIAGNOSIS — Z125 Encounter for screening for malignant neoplasm of prostate: Secondary | ICD-10-CM | POA: Diagnosis not present

## 2021-12-25 DIAGNOSIS — E1169 Type 2 diabetes mellitus with other specified complication: Secondary | ICD-10-CM | POA: Diagnosis not present

## 2021-12-25 DIAGNOSIS — Z5320 Procedure and treatment not carried out because of patient's decision for unspecified reasons: Secondary | ICD-10-CM

## 2021-12-25 DIAGNOSIS — Z2821 Immunization not carried out because of patient refusal: Secondary | ICD-10-CM

## 2021-12-25 LAB — COMPREHENSIVE METABOLIC PANEL
ALT: 30 U/L (ref 0–53)
AST: 19 U/L (ref 0–37)
Albumin: 4.1 g/dL (ref 3.5–5.2)
Alkaline Phosphatase: 82 U/L (ref 39–117)
BUN: 20 mg/dL (ref 6–23)
CO2: 30 mEq/L (ref 19–32)
Calcium: 9.3 mg/dL (ref 8.4–10.5)
Chloride: 102 mEq/L (ref 96–112)
Creatinine, Ser: 1.11 mg/dL (ref 0.40–1.50)
GFR: 75.13 mL/min (ref >60.00)
Glucose, Bld: 112 mg/dL — ABNORMAL HIGH (ref 70–99)
Potassium: 4.6 mEq/L (ref 3.5–5.1)
Sodium: 139 mEq/L (ref 135–145)
Total Bilirubin: 0.4 mg/dL (ref 0.2–1.2)
Total Protein: 6.7 g/dL (ref 6.0–8.3)

## 2021-12-25 LAB — PSA: PSA: 2.06 ng/mL (ref 0.10–4.00)

## 2021-12-25 LAB — MICROALBUMIN / CREATININE URINE RATIO
Creatinine,U: 81.6 mg/dL
Microalb Creat Ratio: 1 mg/g (ref 0.0–30.0)
Microalb, Ur: 0.8 mg/dL (ref 0.0–1.9)

## 2021-12-25 LAB — HEMOGLOBIN A1C: Hgb A1c MFr Bld: 7 % — ABNORMAL HIGH (ref 4.6–6.5)

## 2021-12-25 LAB — LIPID PANEL
Cholesterol: 232 mg/dL — ABNORMAL HIGH (ref 0–200)
HDL: 57.4 mg/dL (ref >39.00)
LDL Cholesterol: 145 mg/dL — ABNORMAL HIGH (ref 0–99)
NonHDL: 174.71
Total CHOL/HDL Ratio: 4
Triglycerides: 149 mg/dL (ref 0.0–149.0)
VLDL: 29.8 mg/dL (ref 0.0–40.0)

## 2021-12-25 LAB — CBC
HCT: 43.4 % (ref 39.0–52.0)
Hemoglobin: 14.4 g/dL (ref 13.0–17.0)
MCHC: 33.2 g/dL (ref 30.0–36.0)
MCV: 87.7 fl (ref 78.0–100.0)
Platelets: 244 10*3/uL (ref 150.0–400.0)
RBC: 4.95 Mil/uL (ref 4.22–5.81)
RDW: 13.5 % (ref 11.5–15.5)
WBC: 5.4 10*3/uL (ref 4.0–10.5)

## 2021-12-25 MED ORDER — KETOCONAZOLE 2 % EX CREA
1.0000 | TOPICAL_CREAM | Freq: Every day | CUTANEOUS | 0 refills | Status: AC
  Filled 2021-12-25: qty 30, 30d supply, fill #0

## 2021-12-25 MED ORDER — RIZATRIPTAN BENZOATE 10 MG PO TABS
10.0000 mg | ORAL_TABLET | ORAL | 2 refills | Status: DC | PRN
Start: 2021-12-25 — End: 2023-11-14
  Filled 2021-12-25: qty 10, 18d supply, fill #0
  Filled 2022-12-18: qty 10, 18d supply, fill #1

## 2021-12-25 NOTE — Progress Notes (Signed)
Chief Complaint  Patient presents with   Annual Exam    Well Male Nathan Brandt is here for a complete physical.   His last physical was >1 year ago.  Current diet: in general, a "healthy" diet.  Current exercise: active at work Weight trend: stable Fatigue out of ordinary? No. Seat belt? Yes.   Advanced directive? No  Health maintenance Shingrix- No Colonoscopy- Yes Tetanus- Yes HIV- Yes Hep C- Yes  Migraines Intermittently gets migraines. Takes Imitrex 100 mg qd prn. Usually works well but makes him drowsy. He is thus hesitant to take prior to work. He has never been on anything else.    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    Medications  Current Outpatient Medications on File Prior to Visit  Medication Sig Dispense Refill   levocetirizine (XYZAL) 5 MG tablet Take 1 tablet (5 mg total) by mouth every evening. 90 tablet 1   losartan (COZAAR) 100 MG tablet Take 1 tablet (100 mg total) by mouth daily. 90 tablet 1    Allergies Allergies  Allergen Reactions   Prednisone Anaphylaxis    uvula swollen    Family History Family History  Problem Relation Age of Onset   Cancer Mother    Diabetes Father    Colon cancer Neg Hx    Colon polyps Neg Hx    Esophageal cancer Neg Hx    Stomach cancer Neg Hx    Rectal cancer Neg Hx     Review of Systems: Constitutional:  no fevers Eye:  no recent significant change in vision Ear/Nose/Mouth/Throat:  Ears:  no hearing loss Nose/Mouth/Throat:  no complaints of nasal congestion, no sore  throat Cardiovascular:  no chest pain Respiratory:  no shortness of breath Gastrointestinal:  no change in bowel habits GU:  Male: negative for dysuria, frequency Musculoskeletal/Extremities:  no joint pain Integumentary (Skin/Breast):  no abnormal skin lesions reported Neurologic:  no headaches Endocrine: No unexpected weight changes Hematologic/Lymphatic:  no abnormal bleeding  Exam BP 120/80   Pulse 71   Temp 98 F (36.7 C) (Oral)   Ht 5\' 4"  (1.626 m)   Wt 262 lb 2 oz (118.9 kg)   SpO2 95%   BMI 44.99 kg/m  General:  well developed, well nourished, in no apparent distress Skin: macerated tiss between 4/5 digits on R foot; otherwise no significant moles, warts, or growths on exposed skin Head:  no masses, lesions, or tenderness Eyes:  pupils equal and round, sclera anicteric without injection Ears:  canals without lesions, TMs shiny without retraction, no obvious effusion, no erythema Nose:  nares patent, septum midline, mucosa normal Throat/Pharynx:  lips and gingiva without lesion; tongue and uvula midline; non-inflamed pharynx; no exudates or postnasal drainage Neck: neck supple without adenopathy, thyromegaly, or masses Cardiac: RRR, no bruits, 2+ pitting b/l LE edema tapering at mid tibia Lungs:  clear to auscultation, breath sounds equal bilaterally, no respiratory distress Abdomen: BS+, soft, non-tender, non-distended, no masses or organomegaly noted Rectal: Deferred Musculoskeletal:  symmetrical muscle groups noted without atrophy or deformity Neuro:  gait normal; deep tendon reflexes normal and symmetric Psych: well oriented with normal range of affect and appropriate judgment/insight  Assessment and Plan  Well adult exam - Plan: CBC, Comprehensive metabolic panel, Lipid panel  Diabetes mellitus type 2 in obese (HCC) - Plan: Hemoglobin A1c, Microalbumin / creatinine urine ratio, Ambulatory referral to Ophthalmology  Migraine with aura and without status  migrainosus, not intractable - Plan: rizatriptan (MAXALT) 10 MG tablet  Screening for prostate cancer - Plan: PSA  Tinea pedis of right foot - Plan: ketoconazole (NIZORAL) 2 % cream  Pneumococcal vaccination declined  Statin declined   [name]. Counseled on diet and exercise. Counseled on risks and benefits of prostate cancer screening with PSA. The patient agrees to undergo testing. Migraines: Adverse effect to medication. Stop Imitrex, change to Maxalt 10 mg/d prn. He will let me know if he wants to change back or switch to something else. Not bothersome enough for prophylaxis.  DM: Diet controlled. Refer ophtho. Offered statin, declines. Explained the decreased risk of heart attack/stroke. He will discuss with his wife. I also offered PCV20 which he declines at this time as well. He will at least discuss this with his wife as well. Advanced directive form provided today.  Rec'd Shingrix. He is in the pre-contemplative phase.  Immunizations, labs, and further orders as above. Follow up in 6 mo or prn. The patient voiced understanding and agreement to the plan.  Nathan Roche [place], DO 12/25/21 8:30 AM

## 2021-12-25 NOTE — Patient Instructions (Addendum)
Give [place] 2-3 business days to get the results of your labs back.   If you do not hear anything about your referral in the next 1-2 weeks, call our office and ask for an update.  Keep the diet clean and stay active.  Aim to do some physical exertion for 150 minutes per week. This is typically divided into 5 days per week, 30 minutes per day. The activity should be enough to get your heart rate up. Anything is better than nothing if you have time constraints.  Please get me a copy of your advanced directive form at your convenience.   Wear your seatbelt.   I strongly recommend the pneumonia shot. If you agree, it would be one shot until you turn 65 and the repeating.   I strongly recommend starting a statin. This decreases the rate of plaque accumulation in your blood vessels, thus decreasing the risk/incidence of heart attacks/strokes.   The Shingrix vaccine (for shingles) is a 2 shot series spaced 2-6 months apart. It can make people feel low energy, achy and almost like they have the flu for 48 hours after injection. 1/5 people can have nausea and/or vomiting. Please plan accordingly when deciding on when to get this shot. Call our office for a nurse visit appointment to get this. The second shot of the series is less severe regarding the side effects, but it still lasts 48 hours.   Let us know if you need anything.

## 2022-01-02 ENCOUNTER — Other Ambulatory Visit (HOSPITAL_BASED_OUTPATIENT_CLINIC_OR_DEPARTMENT_OTHER): Payer: Self-pay

## 2022-01-28 ENCOUNTER — Encounter: Payer: Self-pay | Admitting: Family Medicine

## 2022-01-28 MED ORDER — LOSARTAN POTASSIUM 100 MG PO TABS: 100.0000 mg | ORAL_TABLET | Freq: Every day | ORAL | 1 refills | Status: DC

## 2022-03-04 ENCOUNTER — Ambulatory Visit (INDEPENDENT_AMBULATORY_CARE_PROVIDER_SITE_OTHER): Payer: No Typology Code available for payment source | Admitting: Ophthalmology

## 2022-03-04 ENCOUNTER — Encounter (INDEPENDENT_AMBULATORY_CARE_PROVIDER_SITE_OTHER): Payer: Self-pay | Admitting: Ophthalmology

## 2022-03-04 DIAGNOSIS — H43813 Vitreous degeneration, bilateral: Secondary | ICD-10-CM | POA: Insufficient documentation

## 2022-03-04 DIAGNOSIS — E1169 Type 2 diabetes mellitus with other specified complication: Secondary | ICD-10-CM

## 2022-03-04 DIAGNOSIS — H2513 Age-related nuclear cataract, bilateral: Secondary | ICD-10-CM | POA: Diagnosis not present

## 2022-03-04 DIAGNOSIS — E669 Obesity, unspecified: Secondary | ICD-10-CM | POA: Diagnosis not present

## 2022-03-04 NOTE — Assessment & Plan Note (Signed)
Mild NSC OU.

## 2022-03-04 NOTE — Assessment & Plan Note (Signed)

## 2022-03-04 NOTE — Progress Notes (Signed)
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 male who presents to the clinic today for:   HPI   Diabetic eye exam ref by pcp  Pt states his vision has been stable Pt admits to floaters  Pt denies FOL  Last edited by Aleene Davidson, CMA on 03/04/2022  8:06 AM.      Referring physician: Sharlene Dory, DO [address] [place],  [place] [address]  HISTORICAL INFORMATION:   Selected notes from the MEDICAL RECORD NUMBER    Lab Results  Component Value Date   HGBA1C 7.0 (H) 12/25/2021     CURRENT MEDICATIONS: No current outpatient medications on file. (Ophthalmic Drugs)   No current facility-administered medications for this visit. (Ophthalmic Drugs)   Current Outpatient Medications (Other)  Medication Sig   levocetirizine (XYZAL) 5 MG tablet Take 1 tablet (5 mg total) by mouth every evening.   losartan (COZAAR) 100 MG tablet Take 1 tablet (100 mg total) by mouth daily.   rizatriptan (MAXALT) 10 MG tablet Take 1 tablet (10 mg total) by mouth as needed for migraine. May repeat in 2 hours if needed   No current facility-administered medications for this visit. (Other)      REVIEW OF SYSTEMS: ROS   Negative for: Constitutional, Gastrointestinal, Neurological, Skin, Genitourinary, Musculoskeletal, HENT, Endocrine, Cardiovascular, Eyes, Respiratory, Psychiatric, Allergic/Imm, Heme/Lymph Last edited by Erling Cruz D, CMA on 03/04/2022  8:06 AM.       ALLERGIES Allergies  Allergen Reactions   Prednisone Anaphylaxis    uvula swollen    PAST MEDICAL HISTORY 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   bilateral shoulders   Diabetes mellitus type 2 in obese (HCC)    diet controlled- patient reports he has never been told he had DM- no  meds   Hyperlipidemia    diet controlled   Hypertension    on meds   Migraines    Past Surgical History:  Procedure Laterality Date   KIDNEY SURGERY Left    left side surgery. 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    FAMILY HISTORY Family History  Problem Relation Age of Onset   Cancer Mother    Diabetes Father    Colon cancer Neg Hx    Colon polyps Neg Hx    Esophageal cancer Neg Hx    Stomach cancer Neg Hx    Rectal cancer Neg Hx     SOCIAL HISTORY Social History   Tobacco Use   Smoking status: Never   Smokeless tobacco: Never  Vaping Use   Vaping Use: Never used  Substance Use Topics   Alcohol use: Yes    Comment: 2 glasses of wine every 3 months.   Drug use: Never         OPHTHALMIC EXAM:  Base Eye Exam     Visual Acuity (ETDRS)       Right Left   Dist [place] 20/25 +3 20/30 -1         Tonometry (Tonopen, 8:12 AM)       Right Left   Pressure 6 10         Pupils       Pupils APD   Right PERRL None   Left PERRL None  Visual Fields       Left Right    Full Full         Extraocular Movement       Right Left    Full, Ortho Full, Ortho         Dilation     Both eyes: 1.0% Mydriacyl, 2.5% Phenylephrine @ 8:07 AM           Slit Lamp and Fundus Exam     External Exam       Right Left   External Normal Normal         Slit Lamp Exam       Right Left   Lids/Lashes 1+ Dermatochalasis - upper lid 1+ Dermatochalasis - upper lid   Conjunctiva/Sclera White and quiet White and quiet   Cornea Clear Clear   Anterior Chamber Deep and quiet Deep and quiet   Iris Round and reactive Round and reactive   Lens Trace Nuclear sclerosis Trace Nuclear sclerosis   Anterior Vitreous Normal Normal         Fundus Exam       Right Left   Posterior Vitreous Normal Normal   Disc Normal Normal   C/D Ratio 0.2 0.2   Macula Normal Normal   Vessels no DR Normal   Periphery Normal, no holes or tears Normal, no holes  or tears            IMAGING AND PROCEDURES  Imaging and Procedures for 03/04/22  OCT, Retina - OU - Both Eyes       Right Eye Quality was good. Scan locations included subfoveal. Central Foveal Thickness: 300. Progression has no prior data. Findings include normal foveal contour.   Left Eye Quality was good. Scan locations included subfoveal. Central Foveal Thickness: 299. Progression has no prior data. Findings include normal foveal contour.   Notes Incidental posterior vitreous detachment OU      Color Fundus Photography Optos - OU - Both Eyes       Right Eye Progression has no prior data. Disc findings include normal observations. Macula : normal observations.   Left Eye Disc findings include normal observations. Macula : normal observations.   Notes Incidental PVD OU.  No detectable DR              ASSESSMENT/PLAN:  Age-related nuclear cataract of both eyes Mild NSC OU.  Posterior vitreous detachment of both eyes  The nature of posterior vitreous detachment was discussed with the patient as well as its physiology, its age prevalence, and its possible implication regarding retinal breaks and detachment.  An informational brochure was offered to the patient.  All the patient's questions were answered.  The patient was asked to return if new or different flashes or floaters develops.   Patient was instructed to contact office immediately if any new changes were noticed. I explained to the patient that vitreous inside the eye is similar to jello inside a bowl. As the jello melts it can start to pull away from the bowl, similarly the vitreous throughout our lives can begin to pull away from the retina. That process is called a posterior vitreous detachment. In some cases, the vitreous can tug hard enough on the retina to form a retinal tear. I discussed with the patient the signs and symptoms of a retinal detachment.  Do not rub the eye.       ICD-10-CM   1.  Posterior vitreous detachment of both eyes  H43.813 OCT, Retina -  OU - Both Eyes    Color Fundus Photography Optos - OU - Both Eyes    2. Age-related nuclear cataract of both eyes  H25.13     3. Diabetes mellitus type 2 in obese (HCC)  E11.69    E66.9       No detectable diabetic retinopathy in either eye.  2.  No active maculopathy incidental PVD OU.  Incidental early nuclear [place] cataracts OU.  3.  Ophthalmic Meds Ordered this visit:  No orders of the defined types were placed in this encounter.      Return in about 1 year (around 03/05/2023) for DILATE OU, OCT, COLOR FP.  There are no Patient Instructions on file for this visit.   Explained the diagnoses, plan, and follow up with the patient and they expressed understanding.  Patient expressed understanding of the importance of proper follow up care.   Alford Highland Anija Brickner M.D. Diseases & Surgery of the Retina and Vitreous [facility] 03/04/22     Abbreviations: M myopia (nearsighted); A astigmatism; H hyperopia (farsighted); P presbyopia; Mrx spectacle prescription;  CTL contact lenses; OD right eye; OS left eye; OU both eyes  XT exotropia; ET esotropia; PEK punctate epithelial keratitis; PEE punctate epithelial erosions; DES dry eye syndrome; MGD meibomian gland dysfunction; ATs artificial tears; PFAT's preservative free artificial tears; NSC nuclear sclerotic cataract; PSC posterior subcapsular cataract; ERM epi-retinal membrane; PVD posterior vitreous detachment; RD retinal detachment; DM diabetes mellitus; DR diabetic retinopathy; NPDR non-proliferative diabetic retinopathy; PDR proliferative diabetic retinopathy; CSME clinically significant macular edema; DME diabetic macular edema; dbh dot blot hemorrhages; CWS cotton wool spot; POAG primary open angle glaucoma; C/D cup-to-disc ratio; HVF humphrey visual field; GVF goldmann visual field; OCT optical coherence tomography; IOP intraocular pressure; BRVO  Branch retinal vein occlusion; CRVO central retinal vein occlusion; CRAO central retinal artery occlusion; BRAO branch retinal artery occlusion; RT retinal tear; SB scleral buckle; PPV pars plana vitrectomy; VH Vitreous hemorrhage; PRP panretinal laser photocoagulation; IVK intravitreal kenalog; VMT vitreomacular traction; MH Macular hole;  NVD neovascularization of the disc; NVE neovascularization elsewhere; AREDS age related eye disease study; ARMD age related macular degeneration; POAG primary open angle glaucoma; EBMD epithelial/anterior basement membrane dystrophy; ACIOL anterior chamber intraocular lens; IOL intraocular lens; PCIOL posterior chamber intraocular lens; Phaco/IOL phacoemulsification with intraocular lens placement; PRK photorefractive keratectomy; LASIK laser assisted in situ keratomileusis; HTN hypertension; DM diabetes mellitus; COPD chronic obstructive pulmonary disease

## 2022-03-05 ENCOUNTER — Other Ambulatory Visit: Payer: Self-pay | Admitting: Family Medicine

## 2022-03-05 ENCOUNTER — Other Ambulatory Visit (HOSPITAL_BASED_OUTPATIENT_CLINIC_OR_DEPARTMENT_OTHER): Payer: Self-pay

## 2022-03-05 MED ORDER — LOSARTAN POTASSIUM 100 MG PO TABS
100.0000 mg | ORAL_TABLET | Freq: Every day | ORAL | 1 refills | Status: DC
  Filled 2022-03-05: qty 90, 90d supply, fill #0
  Filled 2022-05-30: qty 90, 90d supply, fill #1

## 2022-04-02 ENCOUNTER — Encounter: Payer: Self-pay | Admitting: Family Medicine

## 2022-04-05 ENCOUNTER — Other Ambulatory Visit (HOSPITAL_BASED_OUTPATIENT_CLINIC_OR_DEPARTMENT_OTHER): Payer: Self-pay

## 2022-04-05 ENCOUNTER — Ambulatory Visit (INDEPENDENT_AMBULATORY_CARE_PROVIDER_SITE_OTHER): Payer: No Typology Code available for payment source | Admitting: Family Medicine

## 2022-04-05 ENCOUNTER — Other Ambulatory Visit: Payer: Self-pay | Admitting: Family Medicine

## 2022-04-05 ENCOUNTER — Encounter: Payer: Self-pay | Admitting: Family Medicine

## 2022-04-05 ENCOUNTER — Other Ambulatory Visit (INDEPENDENT_AMBULATORY_CARE_PROVIDER_SITE_OTHER): Payer: No Typology Code available for payment source

## 2022-04-05 VITALS — BP 134/82 | HR 82 | Temp 97.8°F | Ht 64.0 in | Wt 267.5 lb

## 2022-04-05 DIAGNOSIS — R5383 Other fatigue: Secondary | ICD-10-CM

## 2022-04-05 LAB — CBC
HCT: 43.4 % (ref 39.0–52.0)
Hemoglobin: 14.6 g/dL (ref 13.0–17.0)
MCHC: 33.6 g/dL (ref 30.0–36.0)
MCV: 90.1 fl (ref 78.0–100.0)
Platelets: 218 10*3/uL (ref 150.0–400.0)
RBC: 4.81 Mil/uL (ref 4.22–5.81)
RDW: 15.2 % (ref 11.5–15.5)
WBC: 4.1 10*3/uL (ref 4.0–10.5)

## 2022-04-05 LAB — VITAMIN D 25 HYDROXY (VIT D DEFICIENCY, FRACTURES): VITD: 19.07 ng/mL — ABNORMAL LOW (ref 30.00–100.00)

## 2022-04-05 LAB — TSH: TSH: 69.96 u[IU]/mL — ABNORMAL HIGH (ref 0.35–5.50)

## 2022-04-05 LAB — COMPREHENSIVE METABOLIC PANEL
ALT: 33 U/L (ref 0–53)
AST: 24 U/L (ref 0–37)
Albumin: 4.3 g/dL (ref 3.5–5.2)
Alkaline Phosphatase: 69 U/L (ref 39–117)
BUN: 19 mg/dL (ref 6–23)
CO2: 30 mEq/L (ref 19–32)
Calcium: 8.9 mg/dL (ref 8.4–10.5)
Chloride: 103 mEq/L (ref 96–112)
Creatinine, Ser: 1.2 mg/dL (ref 0.40–1.50)
GFR: 68.28 mL/min (ref >60.00)
Glucose, Bld: 115 mg/dL — ABNORMAL HIGH (ref 70–99)
Potassium: 4 mEq/L (ref 3.5–5.1)
Sodium: 141 mEq/L (ref 135–145)
Total Bilirubin: 0.5 mg/dL (ref 0.2–1.2)
Total Protein: 6.8 g/dL (ref 6.0–8.3)

## 2022-04-05 LAB — T4, FREE: Free T4: 0.47 ng/dL — ABNORMAL LOW (ref 0.60–1.60)

## 2022-04-05 MED ORDER — VITAMIN D (ERGOCALCIFEROL) 1.25 MG (50000 UNIT) PO CAPS
50000.0000 [IU] | ORAL_CAPSULE | ORAL | 0 refills | Status: DC
  Filled 2022-04-05: qty 12, 84d supply, fill #0

## 2022-04-05 NOTE — Progress Notes (Signed)
Chief Complaint  Patient presents with   Fatigue    Subjective: Patient is a 56 y.o. male here for fatigue.  Over the past 3-4 mo, has had daytime sleepiness. He gets around 5-6 hrs of sleep nightly and this has not change decently. It happens mainly when he is sitting and doing something peacefully. It does not happen at work where he is active. No changes in PO intake. He does snore at night. Does not wake up gasping for air. Mood is stable.   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   bilateral shoulders   Diabetes mellitus type 2 in obese (HCC)    diet controlled- patient reports he has never been told he had DM- no meds   Hyperlipidemia    diet controlled   Hypertension    on meds   Migraines     Objective: BP 134/82 (BP Location: Left Arm, Cuff Size: Large)   Pulse 82   Temp 97.8 F (36.6 C) (Oral)   Ht 5\' 4"  (1.626 m)   Wt 267 lb 8 oz (121.3 kg)   SpO2 93%   BMI 45.92 kg/m  General: Awake, appears stated age Heart: RRR, 1+ pitting bl LE edema tapering at prox tibia Mouth: MMM Lungs: CTAB, no rales, wheezes or rhonchi. No accessory muscle use Psych: Age appropriate judgment and insight, normal affect and mood  Assessment and Plan: Other fatigue - Plan: CBC, Comprehensive metabolic panel, TSH, VITAMIN D 25 Hydroxy (Vit-D Deficiency, Fractures)  Will ck labs. If neg, will refer to sleep team. I think OSA is likely in this scenario. Stay hydrated. Try to get at least 7 hrs of sleep nightly. Counseled on diet/exercise helping briefly. F/u prn.  The patient voiced understanding and agreement to the plan.  [place], DO 04/05/22  7:51 AM

## 2022-04-05 NOTE — Patient Instructions (Addendum)
Give [place] 2-3 business days to get the results of your labs back. If they are normal, we will set you up with a sleep specialist.   Stay hydrated.   Try to get at least 7 hrs of sleep nightly.   Let us know if you need anything.

## 2022-04-06 ENCOUNTER — Other Ambulatory Visit: Payer: Self-pay | Admitting: Family Medicine

## 2022-04-06 MED ORDER — LEVOTHYROXINE SODIUM 50 MCG PO TABS: 50.0000 ug | ORAL_TABLET | Freq: Every day | ORAL | 3 refills | Status: DC

## 2022-04-06 NOTE — Addendum Note (Signed)
Addended by: Ames Coupe on: 04/06/2022 06:42 AM   Modules accepted: Level of Service

## 2022-04-15 ENCOUNTER — Other Ambulatory Visit (HOSPITAL_COMMUNITY): Payer: Self-pay

## 2022-04-15 ENCOUNTER — Encounter (HOSPITAL_COMMUNITY): Payer: Self-pay

## 2022-04-25 ENCOUNTER — Other Ambulatory Visit (HOSPITAL_COMMUNITY): Payer: Self-pay

## 2022-04-29 ENCOUNTER — Encounter: Payer: Self-pay | Admitting: Family Medicine

## 2022-04-29 ENCOUNTER — Other Ambulatory Visit (HOSPITAL_BASED_OUTPATIENT_CLINIC_OR_DEPARTMENT_OTHER): Payer: Self-pay

## 2022-04-29 ENCOUNTER — Other Ambulatory Visit (HOSPITAL_COMMUNITY): Payer: Self-pay

## 2022-04-29 ENCOUNTER — Ambulatory Visit (INDEPENDENT_AMBULATORY_CARE_PROVIDER_SITE_OTHER): Payer: No Typology Code available for payment source | Admitting: Family Medicine

## 2022-04-29 VITALS — BP 136/88 | HR 86 | Temp 98.3°F | Ht 64.0 in | Wt 268.2 lb

## 2022-04-29 DIAGNOSIS — M4802 Spinal stenosis, cervical region: Secondary | ICD-10-CM

## 2022-04-29 DIAGNOSIS — R5383 Other fatigue: Secondary | ICD-10-CM | POA: Diagnosis not present

## 2022-04-29 MED ORDER — TIZANIDINE HCL 4 MG PO TABS
4.0000 mg | ORAL_TABLET | Freq: Four times a day (QID) | ORAL | 0 refills | Status: DC | PRN
  Filled 2022-04-29: qty 30, 8d supply, fill #0

## 2022-04-29 NOTE — Patient Instructions (Addendum)
Heat (pad or rice pillow in microwave) over affected area, 10-15 minutes twice daily.   Ice/cold pack over area for 10-15 min twice daily.  OK to take Tylenol 1000 mg (2 extra strength tabs) or 975 mg (3 regular strength tabs) every 6 hours as needed.  If you do not hear anything about your referral in the next 1-2 weeks, call our office and ask for an update.  Let us know if you need anything.  EXERCISES RANGE OF MOTION (ROM) AND STRETCHING EXERCISES  These exercises may help you when beginning to rehabilitate your issue. In order to successfully resolve your symptoms, you must improve your posture. These exercises are designed to help reduce the forward-head and rounded-shoulder posture which contributes to this condition. Your symptoms may resolve with or without further involvement from your physician, physical therapist or athletic trainer. While completing these exercises, remember:  Restoring tissue flexibility helps normal motion to return to the joints. This allows healthier, less painful movement and activity. An effective stretch should be held for at least 20 seconds, although you may need to begin with shorter hold times for comfort. A stretch should never be painful. You should only feel a gentle lengthening or release in the stretched tissue. Do not do any stretch or exercise that you cannot tolerate.  STRETCH- Axial Extensors Lie on your back on the floor. You may bend your knees for comfort. Place a rolled-up hand towel or dish towel, about 2 inches in diameter, under the part of your head that makes contact with the floor. Gently tuck your chin, as if trying to make a "double chin," until you feel a gentle stretch at the base of your head. Hold 15-20 seconds. Repeat 2-3 times. Complete this exercise 1 time per day.   STRETCH - Axial Extension  Stand or sit on a firm surface. Assume a good posture: chest up, shoulders drawn back, abdominal muscles slightly tense, knees  unlocked (if standing) and feet hip width apart. Slowly retract your chin so your head slides back and your chin slightly lowers. Continue to look straight ahead. You should feel a gentle stretch in the back of your head. Be certain not to feel an aggressive stretch since this can cause headaches later. Hold for 15-20 seconds. Repeat 2-3 times. Complete this exercise 1 time per day.  STRETCH - Cervical Side Bend  Stand or sit on a firm surface. Assume a good posture: chest up, shoulders drawn back, abdominal muscles slightly tense, knees unlocked (if standing) and feet hip width apart. Without letting your nose or shoulders move, slowly tip your right / left ear to your shoulder until your feel a gentle stretch in the muscles on the opposite side of your neck. Hold 15-20 seconds. Repeat 2-3 times. Complete this exercise 1-2 times per day.  STRETCH - Cervical Rotators  Stand or sit on a firm surface. Assume a good posture: chest up, shoulders drawn back, abdominal muscles slightly tense, knees unlocked (if standing) and feet hip width apart. Keeping your eyes level with the ground, slowly turn your head until you feel a gentle stretch along the back and opposite side of your neck. Hold 15-20 seconds. Repeat 2-3 times. Complete this exercise 1-2 times per day.  RANGE OF MOTION - Neck Circles  Stand or sit on a firm surface. Assume a good posture: chest up, shoulders drawn back, abdominal muscles slightly tense, knees unlocked (if standing) and feet hip width apart. Gently roll your head down and around  from the back of one shoulder to the back of the other. The motion should never be forced or painful. Repeat the motion 10-20 times, or until you feel the neck muscles relax and loosen. Repeat 2-3 times. Complete the exercise 1-2 times per day. STRENGTHENING EXERCISES - Cervical Strain and Sprain These exercises may help you when beginning to rehabilitate your injury. They may resolve your  symptoms with or without further involvement from your physician, physical therapist, or athletic trainer. While completing these exercises, remember:  Muscles can gain both the endurance and the strength needed for everyday activities through controlled exercises. Complete these exercises as instructed by your physician, physical therapist, or athletic trainer. Progress the resistance and repetitions only as guided. You may experience muscle soreness or fatigue, but the pain or discomfort you are trying to eliminate should never worsen during these exercises. If this pain does worsen, stop and make certain you are following the directions exactly. If the pain is still present after adjustments, discontinue the exercise until you can discuss the trouble with your clinician.  STRENGTH - Cervical Flexors, Isometric Face a wall, standing about 6 inches away. Place a small pillow, a ball about 6-8 inches in diameter, or a folded towel between your forehead and the wall. Slightly tuck your chin and gently push your forehead into the soft object. Push only with mild to moderate intensity, building up tension gradually. Keep your jaw and forehead relaxed. Hold 10 to 20 seconds. Keep your breathing relaxed. Release the tension slowly. Relax your neck muscles completely before you start the next repetition. Repeat 2-3 times. Complete this exercise 1 time per day.  STRENGTH- Cervical Lateral Flexors, Isometric  Stand about 6 inches away from a wall. Place a small pillow, a ball about 6-8 inches in diameter, or a folded towel between the side of your head and the wall. Slightly tuck your chin and gently tilt your head into the soft object. Push only with mild to moderate intensity, building up tension gradually. Keep your jaw and forehead relaxed. Hold 10 to 20 seconds. Keep your breathing relaxed. Release the tension slowly. Relax your neck muscles completely before you start the next repetition. Repeat 2-3  times. Complete this exercise 1 time per day.  STRENGTH - Cervical Extensors, Isometric  Stand about 6 inches away from a wall. Place a small pillow, a ball about 6-8 inches in diameter, or a folded towel between the back of your head and the wall. Slightly tuck your chin and gently tilt your head back into the soft object. Push only with mild to moderate intensity, building up tension gradually. Keep your jaw and forehead relaxed. Hold 10 to 20 seconds. Keep your breathing relaxed. Release the tension slowly. Relax your neck muscles completely before you start the next repetition. Repeat 2-3 times. Complete this exercise 1 time per day.  POSTURE AND BODY MECHANICS CONSIDERATIONS Keeping correct posture when sitting, standing or completing your activities will reduce the stress put on different body tissues, allowing injured tissues a chance to heal and limiting painful experiences. The following are general guidelines for improved posture. Your physician or physical therapist will provide you with any instructions specific to your needs. While reading these guidelines, remember: The exercises prescribed by your provider will help you have the flexibility and strength to maintain correct postures. The correct posture provides the optimal environment for your joints to work. All of your joints have less wear and tear when properly supported by a spine with  good posture. This means you will experience a healthier, less painful body. Correct posture must be practiced with all of your activities, especially prolonged sitting and standing. Correct posture is as important when doing repetitive low-stress activities (typing) as it is when doing a single heavy-load activity (lifting).  PROLONGED STANDING WHILE SLIGHTLY LEANING FORWARD When completing a task that requires you to lean forward while standing in one place for a long time, place either foot up on a stationary 2- to 4-inch high object to help  maintain the best posture. When both feet are on the ground, the low back tends to lose its slight inward curve. If this curve flattens (or becomes too large), then the back and your other joints will experience too much stress, fatigue more quickly, and can cause pain.   RESTING POSITIONS Consider which positions are most painful for you when choosing a resting position. If you have pain with flexion-based activities (sitting, bending, stooping, squatting), choose a position that allows you to rest in a less flexed posture. You would want to avoid curling into a fetal position on your side. If your pain worsens with extension-based activities (prolonged standing, working overhead), avoid resting in an extended position such as sleeping on your stomach. Most people will find more comfort when they rest with their spine in a more neutral position, neither too rounded nor too arched. Lying on a non-sagging bed on your side with a pillow between your knees, or on your back with a pillow under your knees will often provide some relief. Keep in mind, being in any one position for a prolonged period of time, no matter how correct your posture, can still lead to stiffness.  WALKING Walk with an upright posture. Your ears, shoulders, and hips should all line up. OFFICE WORK When working at a desk, create an environment that supports good, upright posture. Without extra support, muscles fatigue and lead to excessive strain on joints and other tissues.  CHAIR: A chair should be able to slide under your desk when your back makes contact with the back of the chair. This allows you to work closely. The chair's height should allow your eyes to be level with the upper part of your monitor and your hands to be slightly lower than your elbows. Body position: Your feet should make contact with the floor. If this is not possible, use a foot rest. Keep your ears over your shoulders. This will reduce stress on your neck  and low back.

## 2022-04-29 NOTE — Progress Notes (Signed)
Musculoskeletal Exam  Patient: Nathan Brandt DOB: [birth year]  DOS: 04/29/2022  SUBJECTIVE:  Chief Complaint:   Chief Complaint  Patient presents with   Shoulder Pain    right    Nathan Brandt is a 56 y.o.  male for evaluation and treatment of R shoulder pain.   Onset:  2 weeks ago. No inj or change in activity.  Location: top of R shoulder Character:  aching and throbbing; pins and needles sensation as well Progression of issue:  is unchanged Associated symptoms: radiates down arm No redness, bruising, swelling, decreased ROM Treatment: to date has been: activity modification Neurovascular symptoms: no  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   bilateral shoulders   Diabetes mellitus type 2 in obese (HCC)    diet controlled- patient reports he has never been told he had DM- no meds   Hyperlipidemia    diet controlled   Hypertension    on meds   Migraines     Objective: VITAL SIGNS: BP 136/88 (BP Location: Left Arm, Patient Position: Sitting, Cuff Size: Normal)   Pulse 86   Temp 98.3 F (36.8 C) (Oral)   Ht 5\' 4"  (1.626 m)   Wt 268 lb 4 oz (121.7 kg)   SpO2 94%   BMI 46.05 kg/m  Constitutional: Well formed, well developed. No acute distress. Thorax & Lungs: No accessory muscle use Musculoskeletal: R shoulder.   Normal active range of motion: yes.   Normal passive range of motion: yes Tenderness to palpation: no Deformity: no Ecchymosis: no Tests positive: Spurling's Tests negative: Neer's, hawkins, Lift off, cross over, Speed's, O'Briens Neurologic: Normal sensory function. No focal deficits noted. DTR's equal and symmetric in UE's. No clonus. Grip strength adequate.  Psychiatric: Normal mood. Age appropriate judgment and insight. Alert & oriented x 3.    Assessment:  Cervical stenosis of spine - Plan: Ambulatory referral to Physical Therapy, tiZANidine (ZANAFLEX) 4 MG tablet  Other fatigue - Plan:  Ambulatory referral to Neurology  Plan: Stretches/exercises, heat, ice, Tylenol, refer to physical therapy, Zanaflex as needed.  He was found to have hypothyroidism and is started levothyroxine.  He has not noticed much improvement in his fatigue.  We were concerned for possible sleep apnea so we will refer him in light of this new information. F/u as originally scheduled. The patient voiced understanding and agreement to the plan.   [place], DO 04/29/22  2:49 PM

## 2022-05-08 ENCOUNTER — Other Ambulatory Visit (HOSPITAL_COMMUNITY): Payer: Self-pay

## 2022-05-09 ENCOUNTER — Other Ambulatory Visit (HOSPITAL_COMMUNITY): Payer: Self-pay

## 2022-05-14 ENCOUNTER — Ambulatory Visit (INDEPENDENT_AMBULATORY_CARE_PROVIDER_SITE_OTHER): Payer: No Typology Code available for payment source | Admitting: Family Medicine

## 2022-05-14 ENCOUNTER — Encounter: Payer: Self-pay | Admitting: Family Medicine

## 2022-05-14 VITALS — BP 144/96 | HR 89 | Temp 98.3°F | Ht 64.0 in | Wt 272.0 lb

## 2022-05-14 DIAGNOSIS — E039 Hypothyroidism, unspecified: Secondary | ICD-10-CM | POA: Diagnosis not present

## 2022-05-14 DIAGNOSIS — I10 Essential (primary) hypertension: Secondary | ICD-10-CM

## 2022-05-14 DIAGNOSIS — M4802 Spinal stenosis, cervical region: Secondary | ICD-10-CM | POA: Diagnosis not present

## 2022-05-14 HISTORY — DX: Hypothyroidism, unspecified: E03.9

## 2022-05-14 LAB — TSH: TSH: 11.06 u[IU]/mL — ABNORMAL HIGH (ref 0.35–5.50)

## 2022-05-14 LAB — T4, FREE: Free T4: 0.8 ng/dL (ref 0.60–1.60)

## 2022-05-14 NOTE — Patient Instructions (Addendum)
Give [place] 2-3 business days to get the results of your labs back.   Keep the diet clean and stay active.  Call [phone] regarding your sleep study. They are neurologists, but some do sleep medicine.   Check your blood pressures 2-3 times per week, alternating the time of day you check it. If it is high, considering waiting 1-2 minutes and rechecking. If it gets higher, your anxiety is likely creeping up and we should avoid rechecking.   Let us know if you need anything.  Healthy Eating Plan Many factors influence your heart health, including eating and exercise habits. Heart (coronary) risk increases with abnormal blood fat (lipid) levels. Heart-healthy meal planning includes limiting unhealthy fats, increasing healthy fats, and making other small dietary changes. This includes maintaining a healthy body weight to help keep lipid levels within a normal range.  WHAT IS MY PLAN?  Your health care provider recommends that you: Drink a glass of water before meals to help with satiety. Eat slowly. An alternative to the water is to add Metamucil. This will help with satiety as well. It does contain calories, unlike water.  WHAT TYPES OF FAT SHOULD I CHOOSE? Choose healthy fats more often. Choose monounsaturated and polyunsaturated fats, such as olive oil and canola oil, flaxseeds, walnuts, almonds, and seeds. Eat more omega-3 fats. Good choices include salmon, mackerel, sardines, tuna, flaxseed oil, and ground flaxseeds. Aim to eat fish at least two times each week. Avoid foods with partially hydrogenated oils in them. These contain trans fats. Examples of foods that contain trans fats are stick margarine, some tub margarines, cookies, crackers, and other baked goods. If you are going to avoid a fat, this is the one to avoid!  WHAT GENERAL GUIDELINES DO I NEED TO FOLLOW? Check food labels carefully to identify foods with trans fats. Avoid these types of options when possible. Fill one half of  your plate with vegetables and green salads. Eat 4-5 servings of vegetables per day. A serving of vegetables equals 1 cup of raw leafy vegetables,  cup of raw or cooked cut-up vegetables, or  cup of vegetable juice. Fill one fourth of your plate with whole grains. Look for the word "whole" as the first word in the ingredient list. Fill one fourth of your plate with lean protein foods. Eat 4-5 servings of fruit per day. A serving of fruit equals one medium whole fruit,  cup of dried fruit,  cup of fresh, frozen, or canned fruit. Try to avoid fruits in cups/syrups as the sugar content can be high. Eat more foods that contain soluble fiber. Examples of foods that contain this type of fiber are apples, broccoli, carrots, beans, peas, and barley. Aim to get 20-30 g of fiber per day. Eat more home-cooked food and less restaurant, buffet, and fast food. Limit or avoid alcohol. Limit foods that are high in starch and sugar. Avoid fried foods when able. Cook foods by using methods other than frying. Baking, boiling, grilling, and broiling are all great options. Other fat-reducing suggestions include: Removing the skin from poultry. Removing all visible fats from meats. Skimming the fat off of stews, soups, and gravies before serving them. Steaming vegetables in water or broth. Lose weight if you are overweight. Losing just 5-10% of your initial body weight can help your overall health and prevent diseases such as diabetes and heart disease. Increase your consumption of nuts, legumes, and seeds to 4-5 servings per week. One serving of dried beans or legumes  equals  cup after being cooked, one serving of nuts equals 1 ounces, and one serving of seeds equals  ounce or 1 tablespoon.  WHAT ARE GOOD FOODS CAN I EAT? Grains Grainy breads (try to find bread that is 3 g of fiber per slice or greater), oatmeal, light popcorn. Whole-grain cereals. Rice and pasta, including brown rice and those that are made  with whole wheat. Edamame pasta is a great alternative to grain pasta. It has a higher protein content. Try to avoid significant consumption of white bread, sugary cereals, or pastries/baked goods.  Vegetables All vegetables. Cooked white potatoes do not count as vegetables.  Fruits All fruits, but limit pineapple and bananas as these fruits have a higher sugar content.  Meats and Other Protein Sources Lean, well-trimmed beef, veal, pork, and lamb. Chicken and [place] without skin. All fish and shellfish. Wild duck, rabbit, pheasant, and venison. Egg whites or low-cholesterol egg substitutes. Dried beans, peas, lentils, and tofu. Seeds and most nuts.  Dairy Low-fat or nonfat cheeses, including ricotta, string, and mozzarella. Skim or 1% milk that is liquid, powdered, or evaporated. Buttermilk that is made with low-fat milk. Nonfat or low-fat yogurt. Soy/Almond milk are good alternatives if you cannot handle dairy.  Beverages Water is the best for you. Sports drinks with less sugar are more desirable unless you are a highly active athlete.  Sweets and Desserts Sherbets and fruit ices. Honey, jam, marmalade, jelly, and syrups. Dark chocolate.  Eat all sweets and desserts in moderation.  Fats and Oils Nonhydrogenated (trans-free) margarines. Vegetable oils, including soybean, sesame, sunflower, olive, peanut, safflower, corn, canola, and cottonseed. Salad dressings or mayonnaise that are made with a vegetable oil. Limit added fats and oils that you use for cooking, baking, salads, and as spreads.  Other Cocoa powder. Coffee and tea. Most condiments.  The items listed above may not be a complete list of recommended foods or beverages. Contact your dietitian for more options.

## 2022-05-14 NOTE — Progress Notes (Signed)
Chief Complaint  Patient presents with   Follow-up    Subjective: Patient is a 56 y.o. male here for low thyroid.  Hypothyroidism Patient presents for follow-up of hypothyroidism.  Reports compliance with medication overall- levothyroxine 50 mcg/d. Current symptoms include: fatigue Denies: weight gain, constipation, swelling, feeling slow, losing hair, anxiousness, and feeling excessive energy He believes his dose should be not significantly changed  Cervical stenosis Seen recently for this dx. Given home stretches, has not been compliant. No neck pain, having tingling of RUE. Thinks PT might keep him more accountable. No worsening.   Hypertension Patient presents for hypertension follow up. He does not monitor home blood pressures. He is compliant with medications. Patient has these side effects of medication: none He is not adhering to a healthy diet overall. Exercise: none, active at work No CP or SOB.   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   bilateral shoulders   Diabetes mellitus type 2 in obese (HCC)    diet controlled- patient reports he has never been told he had DM- no meds   Hyperlipidemia    diet controlled   Hypertension    on meds   Hypothyroidism 05/14/2022   Migraines     Objective: BP (!) 144/96 (BP Location: Left Arm, Cuff Size: Large)   Pulse 89   Temp 98.3 F (36.8 C) (Oral)   Ht 5\' 4"  (1.626 m)   Wt 272 lb (123.4 kg)   SpO2 96%   BMI 46.69 kg/m  General: Awake, appears stated age Heart: RRR, 2+ pitting LE edema tapering at mid tibia Neck: Supple, symmetric, no thyromegaly noted Lungs: CTAB, no rales, wheezes or rhonchi. No accessory muscle use Psych: Age appropriate judgment and insight, normal affect and mood  Assessment and Plan: Hypothyroidism, unspecified type - Plan: T4, free, TSH  Cervical stenosis of spine - Plan: Ambulatory referral to Physical Therapy  Primary  hypertension  Chronic, unsure if stable. Ck labs today. Cont levothyroxine 50 mcg/d. Refer PT as home exercises not as feasible as originally thought. Chronic, uncontrolled. For now, will cont losartan 100 mg/d. Diet is poor. Needs to improve this. Stay active. Monitor BP at home. Will see him in 1 mo to see how this is going.  The patient voiced understanding and agreement to the plan.  [place], DO 05/14/22  7:47 AM

## 2022-05-15 ENCOUNTER — Other Ambulatory Visit (HOSPITAL_BASED_OUTPATIENT_CLINIC_OR_DEPARTMENT_OTHER): Payer: Self-pay

## 2022-05-15 ENCOUNTER — Encounter: Payer: Self-pay | Admitting: Family Medicine

## 2022-05-15 MED ORDER — LEVOTHYROXINE SODIUM 50 MCG PO TABS
50.0000 ug | ORAL_TABLET | Freq: Every day | ORAL | 3 refills | Status: AC
  Filled 2022-05-15: qty 90, 90d supply, fill #0
  Filled 2022-08-23: qty 90, 90d supply, fill #1
  Filled 2022-11-26: qty 90, 90d supply, fill #2

## 2022-05-28 ENCOUNTER — Ambulatory Visit: Payer: 59 | Admitting: Physical Therapy

## 2022-05-28 NOTE — Therapy (Incomplete)
OUTPATIENT PHYSICAL THERAPY CERVICAL EVALUATION   Patient Name: Nathan Brandt MRN: [id number] DOB:Oct 13, 1965, 56 y.o., male Today's Date: 05/28/2022  END OF SESSION:   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   bilateral shoulders   Diabetes mellitus type 2 in obese (HCC)    diet controlled- patient reports he has never been told he had DM- no meds   Hyperlipidemia    diet controlled   Hypertension    on meds   Hypothyroidism 05/14/2022   Migraines    Past Surgical History:  Procedure Laterality Date   KIDNEY SURGERY Left    left side surgery. 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   Patient Active Problem List   Diagnosis Date Noted   Hypothyroidism 05/14/2022   Primary hypertension 05/14/2022   Age-related nuclear cataract of both eyes 03/04/2022   Posterior vitreous detachment of both eyes 03/04/2022   Migraine with aura and without status migrainosus, not intractable 10/15/2021   Diabetes mellitus type 2 in obese [facility])    Erectile dysfunction 10/25/2020   OA (osteoarthritis) of shoulder 12/23/2018    PCP: Sharlene Dory, DO   REFERRING PROVIDER: Sharlene Dory, DO   REFERRING DIAG: [phone] (ICD-10-CM) - Cervical stenosis of spine  THERAPY DIAG:  No diagnosis found.  Rationale for Evaluation and Treatment: Rehabilitation  ONSET DATE: ***  SUBJECTIVE:                                                                                                                                                                                                         SUBJECTIVE STATEMENT: ***  PERTINENT HISTORY:  HTN, HLD, obesity, T2DM, hypothyroidism, migraines.  Per MD note "cervical stenosis, Seen recently for this dx. Given home stretches, has not been compliant. No neck pain, having tingling of RUE. Thinks PT might keep him more accountable. No worsening."  PAIN:  Are you having pain?  {OPRCPAIN:27236}  PRECAUTIONS: None  WEIGHT BEARING RESTRICTIONS: No  FALLS:  Has patient fallen in last 6 months? {fallsyesno:27318}  LIVING ENVIRONMENT: Lives with: {OPRC lives with:25569::"lives with their family"} Lives in: {Lives in:25570} Stairs: {opstairs:27293} Has following equipment at home: {Assistive devices:23999}  OCCUPATION: ***  PLOF: {PLOF:24004}  PATIENT GOALS: ***  NEXT MD VISIT:   OBJECTIVE:   DIAGNOSTIC FINDINGS:  No recent/relevant imaging  PATIENT SURVEYS:  NDI ***  COGNITION: Overall cognitive status: {cognition:24006}  SENSATION: {sensation:27233}  POSTURE: {posture:25561}  PALPATION: ***   CERVICAL ROM:   Active ROM AROM (deg) eval  Flexion   Extension  Right lateral flexion   Left lateral flexion   Right rotation   Left rotation    (Blank rows = not tested)  UPPER EXTREMITY ROM:  {AROM/PROM:27142} ROM Right eval Left eval  Shoulder flexion    Shoulder extension    Shoulder abduction    Shoulder adduction    Shoulder internal rotation    Shoulder external rotation    Elbow flexion    Elbow extension    Wrist flexion    Wrist extension    Wrist ulnar deviation    Wrist radial deviation    Wrist pronation    Wrist supination     (Blank rows = not tested)  UPPER EXTREMITY MMT:  MMT Right eval Left eval  Shoulder flexion    Shoulder extension    Shoulder abduction    Shoulder adduction    Shoulder internal rotation    Shoulder external rotation    Elbow flexion    Elbow extension    Wrist flexion    Wrist extension    Wrist ulnar deviation    Wrist radial deviation    Wrist pronation    Wrist supination    Grip strength     (Blank rows = not tested)  CERVICAL SPECIAL TESTS:  {Cervical special tests:25246}  FUNCTIONAL TESTS:  {Functional tests:24029}  TODAY'S TREATMENT:                                                                                                                               DATE: ***   PATIENT EDUCATION:  Education details: *** Person educated: {Person educated:25204} Education method: {Education Method:25205} Education comprehension: {Education Comprehension:25206}  HOME EXERCISE PROGRAM: ***  ASSESSMENT:  CLINICAL IMPRESSION: Patient is a 56 y.o. ***hand dominant male who was seen today for physical therapy evaluation and treatment for cervical stenosis.  ***   OBJECTIVE IMPAIRMENTS: {opptimpairments:25111}.   ACTIVITY LIMITATIONS: {activitylimitations:27494}  PARTICIPATION LIMITATIONS: {participationrestrictions:25113}  PERSONAL FACTORS: {Personal factors:25162} are also affecting patient's functional outcome.   REHAB POTENTIAL: {rehabpotential:25112}  CLINICAL DECISION MAKING: {clinical decision making:25114}  EVALUATION COMPLEXITY: {Evaluation complexity:25115}   GOALS: Goals reviewed with patient? {yes/no:20286}  SHORT TERM GOALS: Target date: {follow up:25551}   Patient will be independent with initial HEP.  Baseline: *** Goal status: INITIAL   LONG TERM GOALS: Target date: {follow up:25551}   Patient will be independent with advanced/ongoing HEP to improve outcomes and carryover.  Baseline:   Goal status: INITIAL  2.  Patient will report 75% improvement in neck pain to improve QOL.  Baseline:  Goal status: INITIAL  3.  Patient will demonstrate full pain free cervical ROM for safety with driving.  Baseline: see objective Goal status: INITIAL  4.  Patient will report 15% improvement on NDI  to demonstrate improved functional ability.  Baseline: *** Goal status: INITIAL  5.  Patient will demonstrate improved posture to decrease muscle imbalance. Baseline: *** Goal status: INITIAL  6. Patient will ***  Baseline: *** Goal status: {GOALSTATUS:25110}   7. Patient will *** Baseline: *** Goal status: {GOALSTATUS:25110}    PLAN:  PT FREQUENCY: 1-2x/week  PT DURATION: 6 weeks  PLANNED INTERVENTIONS:  Therapeutic exercises, Therapeutic activity, Neuromuscular re-education, Balance training, Gait training, Patient/Family education, Self Care, Joint mobilization, Dry Needling, Electrical stimulation, Spinal mobilization, Cryotherapy, Moist heat, Traction, Ultrasound, Manual therapy, and Re-evaluation  PLAN FOR NEXT SESSION: ***   Jena Gauss, PT, DPT  05/28/2022, 5:25 PM

## 2022-05-29 ENCOUNTER — Ambulatory Visit: Payer: 59 | Attending: Family Medicine | Admitting: Physical Therapy

## 2022-06-11 ENCOUNTER — Ambulatory Visit: Payer: 59 | Attending: Family Medicine | Admitting: Physical Therapy

## 2022-06-11 ENCOUNTER — Encounter: Payer: Self-pay | Admitting: Physical Therapy

## 2022-06-11 DIAGNOSIS — M4802 Spinal stenosis, cervical region: Secondary | ICD-10-CM | POA: Insufficient documentation

## 2022-06-11 DIAGNOSIS — R252 Cramp and spasm: Secondary | ICD-10-CM | POA: Diagnosis not present

## 2022-06-11 DIAGNOSIS — M542 Cervicalgia: Secondary | ICD-10-CM | POA: Diagnosis not present

## 2022-06-11 NOTE — Therapy (Signed)
OUTPATIENT PHYSICAL THERAPY CERVICAL EVALUATION   Patient Name: Nathan Brandt MRN: [id number] DOB:14-Feb-1966, 57 y.o., male Today's Date: 06/11/2022  END OF SESSION:  PT End of Session - 06/11/22 1622     Visit Number 1    Number of Visits 12    Date for PT Re-Evaluation 07/23/22    Authorization Type [facility]    PT Start Time 3220    PT Stop Time 1618    PT Time Calculation (min) 28 min    Activity Tolerance Patient tolerated treatment well    Behavior During Therapy [facility] for tasks assessed/performed             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   bilateral shoulders   Diabetes mellitus type 2 in obese ([place])    diet controlled- patient reports he has never been told he had DM- no meds   Hyperlipidemia    diet controlled   Hypertension    on meds   Hypothyroidism 05/14/2022   Migraines    Past Surgical History:  Procedure Laterality Date   KIDNEY SURGERY Left    left side surgery. 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   Patient Active Problem List   Diagnosis Date Noted   Hypothyroidism 05/14/2022   Primary hypertension 05/14/2022   Age-related nuclear cataract of both eyes 03/04/2022   Posterior vitreous detachment of both eyes 03/04/2022   Migraine with aura and without status migrainosus, not intractable 10/15/2021   Diabetes mellitus type 2 in obese [facility])    Erectile dysfunction 10/25/2020   OA (osteoarthritis) of shoulder 12/23/2018    PCP: Nathan Pal, DO   REFERRING PROVIDER: Shelda Brandt*   REFERRING DIAG: [phone] (ICD-10-CM) - Cervical stenosis of spine  THERAPY DIAG:  Cervicalgia - Plan: PT plan of care cert/re-cert  Cramp and spasm - Plan: PT plan of care cert/re-cert  Rationale for Evaluation and Treatment: Rehabilitation  ONSET DATE: 2.5 months ago  SUBJECTIVE:                                                                                                                                                                                                          SUBJECTIVE STATEMENT: Patient reports his arm had been going to sleep, when to his doctor was told it was his neck, but it has improved quite a bit.  Not going as numb now over the last 2 weeks.     PERTINENT HISTORY:  HTN, hypothyroidism, chronic L shoulder pain (DJD, RTC  tear), T2DM, migraines  PAIN:  Are you having pain? Yes: NPRS scale: 1/10 Pain location: R shoulder/neck  Pain description: arm would get tingling Aggravating factors: laying down at night, laying on side (either) Relieving factors: exercises and stretches  PRECAUTIONS: None  WEIGHT BEARING RESTRICTIONS: No  FALLS:  Has patient fallen in last 6 months? 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 Mild degenerative disc disease at C4-5.   PATIENT SURVEYS:  NDI 9/50 = 18% mild disability  COGNITION: Overall cognitive status: Within functional limits for tasks assessed  SENSATION: WFL  POSTURE: rounded shoulders  PALPATION: Tightness R UT/levator scapulae and scalenes, but no tenderness.    CERVICAL ROM:   Active ROM AROM (deg) eval  Flexion 30  Extension 45  Right lateral flexion 30* twinge  Left lateral flexion 40  Right rotation 70  Left rotation 60   (Blank rows = not tested)  UPPER EXTREMITY ROM:  Active ROM Right eval Left eval  Shoulder flexion 145*tight tingly 145  Shoulder extension    Shoulder abduction 135*tingly, mild pain 150  Shoulder adduction    Shoulder internal rotation Right flank, tight midback   Shoulder external rotation Nape*tingly nape   (Blank rows = not tested)  UPPER EXTREMITY MMT:  MMT Right eval Left eval  Shoulder flexion 5 5  Shoulder extension    Shoulder abduction 5 5  Shoulder adduction    Shoulder internal rotation 5 5  Shoulder external rotation 5 5  Elbow flexion 5 5  Elbow extension 5 5  Wrist flexion 5 5  Wrist extension 5 5  Grip strength (lbs) 70, 72, 75 65, 62, 65   (Blank rows = not tested)  CERVICAL SPECIAL TESTS:  Spurling's test: Positive  FUNCTIONAL TESTS:  NA  TODAY'S TREATMENT:                                                                                                                              DATE:    06/11/22 see patient education  PATIENT EDUCATION:  Education details: findings, POC, importance of back strengthening and posture Person educated: Patient Education method: Explanation Education comprehension: verbalized understanding  HOME EXERCISE PROGRAM: TBD  ASSESSMENT:  CLINICAL IMPRESSION: Patient is a 57 y.o. right hand dominant male who was seen today for physical therapy evaluation and treatment for cervical stenosis.  He reports 2 month history of RUE tingling/numbness that has been improving.  Today noted impairments in cervical ROM, R shoulder ROM, and positive Spurlings on Right, but no deficits in RUE strength.  He also has history of L  shoulder OA and injury, and discussed importance of strengthening for both shoulders to prevent further injury as he has a manual job (installing cabinets) but does not do any regular exercise or strengthening.   Nathan Brandt would benefit from skilled physical therapy to decrease RUE tingling, improve posture, strength, ROM, in order to improve sleep and be able to perform job activities safely.   OBJECTIVE IMPAIRMENTS: decreased activity tolerance, decreased ROM, decreased strength, increased fascial restrictions, increased muscle spasms, impaired sensation, impaired UE  functional use, postural dysfunction, and pain.   ACTIVITY LIMITATIONS: carrying, lifting, bending, sleeping, reach over head, and hygiene/grooming  PARTICIPATION LIMITATIONS: occupation  PERSONAL FACTORS: 1-2 comorbidities: hypothyroidism, L shoulder OA, T2DM, obesity  are also affecting patient's functional outcome.   REHAB POTENTIAL: Good  CLINICAL DECISION MAKING: Stable/uncomplicated  EVALUATION COMPLEXITY: Low   GOALS: Goals reviewed with patient? Yes  SHORT TERM GOALS: Target date: 06/25/2022   Patient will be independent with initial HEP.  Baseline: needs Goal status: INITIAL  LONG TERM GOALS: Target date: 07/23/2022   Patient will be independent with advanced/ongoing HEP to improve outcomes and carryover.  Baseline: Goal status: INITIAL  2.  Patient will report 75% improvement in neck/R shoulder tingling/numbness to improve QOL.  Baseline:  Goal status: INITIAL  3.  Patient will demonstrate full pain free cervical ROM for safety with driving.  Baseline: see objective Goal status: INITIAL  4.  Patient will report no more than 4% impairment on NDI to demonstrate improved functional ability.  Baseline: 18% impairment Goal status: INITIAL  5.  Patient will report 75% improvement in sleep disruption due to shoulder pain. Baseline:  Goal status: INITIAL  6. Patient will demonstrate full R shoulder ROM without pain = L shoulder ROM for job activities.    Baseline: see objective Goal status: INITIAL    PLAN:  PT FREQUENCY: 1-2x/week  PT DURATION: 6 weeks  PLANNED INTERVENTIONS: Therapeutic exercises, Therapeutic activity, Neuromuscular re-education, Balance training, Gait training, Patient/Family education, Self Care, Joint mobilization, Dry Needling, Electrical stimulation, Spinal mobilization, Cryotherapy, Moist heat, Traction, Ultrasound, Manual therapy, and Re-evaluation  PLAN FOR NEXT SESSION: focus on postural strengthening, lats, has total gym at home,  manual therapy/ TrDN to R UT/LS, modalities PRN   Jena Gauss, PT, DPT  06/11/2022, 5:42 PM

## 2022-06-13 ENCOUNTER — Ambulatory Visit: Payer: 59

## 2022-06-17 ENCOUNTER — Ambulatory Visit: Payer: 59

## 2022-06-17 DIAGNOSIS — R252 Cramp and spasm: Secondary | ICD-10-CM | POA: Diagnosis not present

## 2022-06-17 DIAGNOSIS — M542 Cervicalgia: Secondary | ICD-10-CM | POA: Diagnosis not present

## 2022-06-17 DIAGNOSIS — M4802 Spinal stenosis, cervical region: Secondary | ICD-10-CM | POA: Diagnosis not present

## 2022-06-17 NOTE — Therapy (Signed)
OUTPATIENT PHYSICAL THERAPY CERVICAL TREATMENT   Patient Name: Nathan Brandt MRN: [id number] DOB:08-23-1965, 57 y.o., male Today's Date: 06/17/2022  END OF SESSION:  PT End of Session - 06/17/22 1717     Visit Number 2    Number of Visits 12    Date for PT Re-Evaluation 07/23/22    Authorization Type [facility]    PT Start Time 1616    PT Stop Time 1700    PT Time Calculation (min) 44 min    Activity Tolerance Patient tolerated treatment well    Behavior During Therapy [facility] for tasks assessed/performed              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   bilateral shoulders   Diabetes mellitus type 2 in obese (HCC)    diet controlled- patient reports he has never been told he had DM- no meds   Hyperlipidemia    diet controlled   Hypertension    on meds   Hypothyroidism 05/14/2022   Migraines    Past Surgical History:  Procedure Laterality Date   KIDNEY SURGERY Left    left side surgery. 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   Patient Active Problem List   Diagnosis Date Noted   Hypothyroidism 05/14/2022   Primary hypertension 05/14/2022   Age-related nuclear cataract of both eyes 03/04/2022   Posterior vitreous detachment of both eyes 03/04/2022   Migraine with aura and without status migrainosus, not intractable 10/15/2021   Diabetes mellitus type 2 in obese [facility])    Erectile dysfunction 10/25/2020   OA (osteoarthritis) of shoulder 12/23/2018    PCP: Sharlene Dory, DO   REFERRING PROVIDER: Sharlene Dory*   REFERRING DIAG: [phone] (ICD-10-CM) - Cervical stenosis of spine  THERAPY DIAG:  Cervicalgia  Cramp and spasm  Rationale for Evaluation and Treatment: Rehabilitation  ONSET DATE: 2.5 months ago  SUBJECTIVE:                                                                                                                                                                                                          SUBJECTIVE STATEMENT: Pt reports he does not have numbness in his hands as much now.    PERTINENT HISTORY:  HTN, hypothyroidism, chronic L shoulder pain (DJD, RTC tear), T2DM, migraines  PAIN:  Are you having pain? Yes: NPRS scale: 1/10 Pain location: R shoulder/neck  Pain description: arm would get tingling Aggravating factors: laying down at night, laying on side (either) Relieving factors: exercises  and stretches  PRECAUTIONS: None  WEIGHT BEARING RESTRICTIONS: No  FALLS:  Has patient fallen in last 6 months? 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 Mild degenerative disc disease at C4-5.   PATIENT SURVEYS:  NDI 9/50 = 18% mild disability  COGNITION: Overall cognitive status: Within functional limits for tasks assessed  SENSATION: WFL  POSTURE: rounded shoulders  PALPATION: Tightness R UT/levator scapulae and scalenes, but no tenderness.    CERVICAL ROM:   Active ROM AROM (deg) eval  Flexion 30  Extension 45  Right lateral flexion 30* twinge  Left lateral flexion 40  Right rotation 70  Left rotation 60   (Blank rows = not tested)  UPPER EXTREMITY ROM:  Active ROM Right eval Left eval  Shoulder flexion 145*tight tingly 145  Shoulder extension    Shoulder abduction 135*tingly, mild pain 150  Shoulder adduction    Shoulder internal rotation Right flank, tight midback  Shoulder external rotation Nape*tingly nape   (Blank rows = not tested)  UPPER EXTREMITY MMT:  MMT Right eval Left eval  Shoulder flexion 5 5  Shoulder extension    Shoulder  abduction 5 5  Shoulder adduction    Shoulder internal rotation 5 5  Shoulder external rotation 5 5  Elbow flexion 5 5  Elbow extension 5 5  Wrist flexion 5 5  Wrist extension 5 5  Grip strength (lbs) 70, 72, 75 65, 62, 65   (Blank rows = not tested)  CERVICAL SPECIAL TESTS:  Spurling's test: Positive  FUNCTIONAL TESTS:  NA  TODAY'S TREATMENT:                                                                                                                              DATE:    06/17/22 Therapeutic Exercise: UBE L1.0 3 min fwd/ 3 min back Standing shoulder extension 2x10 GTB Standing row GTB 2x10 Seated lat pulls 45# 2x10 Standing ER bil with GTB x 15 Seated horizontal ABD chest supported x 12 GTB  Wall push up w/ a plus x 10  Serratus roll on wall w/ foam roll x 10  06/11/22 see patient education  PATIENT EDUCATION:  Education details: HEP update Person educated: Patient Education method: Explanation Education comprehension: verbalized understanding  HOME EXERCISE PROGRAM: Access Code: [email or web address] URL: [email or web address] Date: 06/17/2022 Prepared by: Clarene Essex  Exercises - Shoulder External Rotation and Scapular Retraction with Resistance  - 1 x daily - 7 x weekly - 2 sets - 10 reps - Standing Shoulder Row with Anchored Resistance  - 1 x daily - 7 x weekly -  2 sets - 10 reps - Shoulder extension with resistance - Neutral  - 1 x daily - 7 x weekly - 2 sets - 10 reps - Standing Shoulder Horizontal Abduction with Anchored Resistance  - 1 x daily - 7 x weekly - 2 sets - 10 reps  ASSESSMENT:  CLINICAL IMPRESSION: Good response to treatment as we progressed postural strengthening. No increase in shoulder pain with exercises but reports of clicking in R shoulder with certain exercises. Provided education on proper posture and periscap muscle facilitation during exercises. Will continue to progress exercises to improve posture and shoulder stability.    OBJECTIVE IMPAIRMENTS: decreased activity tolerance, decreased ROM, decreased strength, increased fascial restrictions, increased muscle spasms, impaired sensation, impaired UE functional use, postural dysfunction, and pain.   ACTIVITY LIMITATIONS: carrying, lifting, bending, sleeping, reach over head, and hygiene/grooming  PARTICIPATION LIMITATIONS: occupation  PERSONAL FACTORS: 1-2 comorbidities: hypothyroidism, L shoulder OA, T2DM, obesity  are also affecting patient's functional outcome.   REHAB POTENTIAL: Good  CLINICAL DECISION MAKING: Stable/uncomplicated  EVALUATION COMPLEXITY: Low   GOALS: Goals reviewed with patient? Yes  SHORT TERM GOALS: Target date: 06/25/2022   Patient will be independent with initial HEP.  Baseline: needs Goal status: IN PROGRESS  LONG TERM GOALS: Target date: 07/23/2022   Patient will be independent with advanced/ongoing HEP to improve outcomes and carryover.  Baseline: Goal status: IN PROGRESS  2.  Patient will report 75% improvement in neck/R shoulder tingling/numbness to improve QOL.  Baseline:  Goal status: IN PROGRESS  3.  Patient will demonstrate full pain free cervical ROM for safety with driving.  Baseline: see objective Goal status: IN PROGRESS  4.  Patient will report no more than 4% impairment on NDI to demonstrate improved functional ability.  Baseline: 18% impairment Goal status: IN PROGRESS  5.  Patient will report 75% improvement in sleep disruption due to shoulder pain. Baseline:  Goal status: IN PROGRESS  6. Patient will demonstrate full R shoulder ROM without pain = L shoulder ROM for job activities.    Baseline: see objective Goal status: IN PROGRESS    PLAN:  PT FREQUENCY: 1-2x/week  PT DURATION: 6 weeks  PLANNED INTERVENTIONS: Therapeutic exercises, Therapeutic activity, Neuromuscular re-education, Balance training, Gait training, Patient/Family education, Self Care, Joint mobilization, Dry Needling,  Electrical stimulation, Spinal mobilization, Cryotherapy, Moist heat, Traction, Ultrasound, Manual therapy, and Re-evaluation  PLAN FOR NEXT SESSION: focus on postural strengthening, lats, has total gym at home, manual therapy/ TrDN to R UT/LS, modalities PRN   Darleene Cleaver, PTA 06/17/2022, 5:18 PM

## 2022-06-20 ENCOUNTER — Ambulatory Visit: Payer: 59 | Admitting: Physical Therapy

## 2022-06-20 ENCOUNTER — Encounter: Payer: Self-pay | Admitting: Family Medicine

## 2022-06-25 ENCOUNTER — Other Ambulatory Visit: Payer: Self-pay | Admitting: Family Medicine

## 2022-06-25 ENCOUNTER — Ambulatory Visit (INDEPENDENT_AMBULATORY_CARE_PROVIDER_SITE_OTHER): Payer: 59 | Admitting: Family Medicine

## 2022-06-25 ENCOUNTER — Other Ambulatory Visit (HOSPITAL_BASED_OUTPATIENT_CLINIC_OR_DEPARTMENT_OTHER): Payer: Self-pay

## 2022-06-25 ENCOUNTER — Encounter: Payer: Self-pay | Admitting: Family Medicine

## 2022-06-25 VITALS — BP 138/80 | HR 82 | Temp 98.2°F | Ht 64.0 in | Wt 273.0 lb

## 2022-06-25 DIAGNOSIS — E1169 Type 2 diabetes mellitus with other specified complication: Secondary | ICD-10-CM

## 2022-06-25 DIAGNOSIS — I10 Essential (primary) hypertension: Secondary | ICD-10-CM

## 2022-06-25 DIAGNOSIS — E669 Obesity, unspecified: Secondary | ICD-10-CM

## 2022-06-25 DIAGNOSIS — E559 Vitamin D deficiency, unspecified: Secondary | ICD-10-CM

## 2022-06-25 LAB — LIPID PANEL
Cholesterol: 229 mg/dL — ABNORMAL HIGH (ref 0–200)
HDL: 58.9 mg/dL (ref >39.00)
LDL Cholesterol: 141 mg/dL — ABNORMAL HIGH (ref 0–99)
NonHDL: 170.01
Total CHOL/HDL Ratio: 4
Triglycerides: 144 mg/dL (ref 0.0–149.0)
VLDL: 28.8 mg/dL (ref 0.0–40.0)

## 2022-06-25 LAB — COMPREHENSIVE METABOLIC PANEL
ALT: 33 U/L (ref 0–53)
AST: 22 U/L (ref 0–37)
Albumin: 4.2 g/dL (ref 3.5–5.2)
Alkaline Phosphatase: 70 U/L (ref 39–117)
BUN: 17 mg/dL (ref 6–23)
CO2: 28 mEq/L (ref 19–32)
Calcium: 9.1 mg/dL (ref 8.4–10.5)
Chloride: 102 mEq/L (ref 96–112)
Creatinine, Ser: 1.23 mg/dL (ref 0.40–1.50)
GFR: 66.19 mL/min (ref >60.00)
Glucose, Bld: 185 mg/dL — ABNORMAL HIGH (ref 70–99)
Potassium: 4 mEq/L (ref 3.5–5.1)
Sodium: 139 mEq/L (ref 135–145)
Total Bilirubin: 0.5 mg/dL (ref 0.2–1.2)
Total Protein: 6.6 g/dL (ref 6.0–8.3)

## 2022-06-25 LAB — VITAMIN D 25 HYDROXY (VIT D DEFICIENCY, FRACTURES): VITD: 33.99 ng/mL (ref 30.00–100.00)

## 2022-06-25 LAB — HEMOGLOBIN A1C: Hgb A1c MFr Bld: 7.2 % — ABNORMAL HIGH (ref 4.6–6.5)

## 2022-06-25 MED ORDER — METFORMIN HCL ER 500 MG PO TB24
500.0000 mg | ORAL_TABLET | Freq: Every day | ORAL | 5 refills | Status: DC
  Filled 2022-06-25: qty 30, 30d supply, fill #0

## 2022-06-25 MED ORDER — ATORVASTATIN CALCIUM 20 MG PO TABS
20.0000 mg | ORAL_TABLET | Freq: Every day | ORAL | 3 refills | Status: DC
  Filled 2022-06-25: qty 30, 30d supply, fill #0
  Filled 2022-08-23: qty 30, 30d supply, fill #1
  Filled 2022-10-28: qty 30, 30d supply, fill #2
  Filled 2022-11-26: qty 30, 30d supply, fill #3

## 2022-06-25 MED ORDER — VITAMIN D (ERGOCALCIFEROL) 1.25 MG (50000 UNIT) PO CAPS
50000.0000 [IU] | ORAL_CAPSULE | ORAL | 0 refills | Status: AC
  Filled 2022-06-25: qty 12, 84d supply, fill #0

## 2022-06-25 NOTE — Patient Instructions (Signed)
Give us 2-3 business days to get the results of your labs back.   Keep the diet clean and stay active.  Aim to do some physical exertion for 150 minutes per week. This is typically divided into 5 days per week, 30 minutes per day. The activity should be enough to get your heart rate up. Anything is better than nothing if you have time constraints.  Let us know if you need anything.  

## 2022-06-25 NOTE — Progress Notes (Signed)
Subjective:   Chief Complaint  Patient presents with   Follow-up    Nathan Brandt is a 57 y.o. male here for follow-up of diabetes.   Nathan Brandt does not routinely monitor his sugars.  Patient does not require insulin.   Medications include: diet controlled Diet is "not good".  Exercise: active at work.  Hypertension Patient presents for hypertension follow up. He does not monitor home blood pressures. He is compliant with medications- losartan 100 mg/d. Patient has these side effects of medication: none Diet/exercise as above.  No Cp or SOB.   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   bilateral shoulders   Diabetes mellitus type 2 in obese (HCC)    diet controlled- patient reports he has never been told he had DM- no meds   Hyperlipidemia    diet controlled   Hypertension    on meds   Hypothyroidism 05/14/2022   Migraines      Related testing: Retinal exam: Done Pneumovax: not done  Objective:  BP 138/80 (BP Location: Left Arm, Patient Position: Sitting, Cuff Size: Normal)   Pulse 82   Temp 98.2 F (36.8 C) (Oral)   Ht 5\' 4"  (1.626 m)   Wt 273 lb (123.8 kg)   SpO2 93%   BMI 46.86 kg/m  General:  Well developed, well nourished, in no apparent distress Skin:  Warm, no pallor or diaphoresis Lungs:  CTAB, no access msc use Cardio:  RRR, no bruits, 1+ pitting b/l LE edema tapering at mid tibia Psych: Age appropriate judgment and insight  Assessment:   Diabetes mellitus type 2 in obese ([place]) - Plan: Comprehensive metabolic panel, Lipid panel, Hemoglobin A1c  Primary hypertension  Vitamin D deficiency - Plan: VITAMIN D 25 Hydroxy (Vit-D Deficiency, Fractures)   Plan:   Chronic, hopefully stable. Starting Lipitor 20 mg/d. Ck labs today. Counseled on diet and exercise. Chronic, stable. Cont losartan 100 mg/d.  Ck Vit D.  F/u in 6 mo. The patient voiced understanding and agreement to the plan.  [place], DO 06/25/22 8:14 AM

## 2022-06-27 ENCOUNTER — Ambulatory Visit: Payer: 59 | Admitting: Physical Therapy

## 2022-06-27 ENCOUNTER — Encounter: Payer: Self-pay | Admitting: Physical Therapy

## 2022-06-27 DIAGNOSIS — R252 Cramp and spasm: Secondary | ICD-10-CM

## 2022-06-27 DIAGNOSIS — M4802 Spinal stenosis, cervical region: Secondary | ICD-10-CM | POA: Diagnosis not present

## 2022-06-27 DIAGNOSIS — M542 Cervicalgia: Secondary | ICD-10-CM

## 2022-06-27 NOTE — Therapy (Addendum)
OUTPATIENT PHYSICAL THERAPY CERVICAL TREATMENT   Patient Name: Nathan Brandt MRN: [id number] DOB:[birth year], 57 y.o., male Today's Date: 06/27/2022  END OF SESSION:  PT End of Session - 06/27/22 1618     Visit Number 3    Number of Visits 12    Date for PT Re-Evaluation 07/23/22    Authorization Type [facility]    PT Start Time 1616    Activity Tolerance Patient tolerated treatment well    Behavior During Therapy [facility] for tasks assessed/performed              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   bilateral shoulders   Diabetes mellitus type 2 in obese ([place])    diet controlled- patient reports he has never been told he had DM- no meds   Hyperlipidemia    diet controlled   Hypertension    on meds   Hypothyroidism 05/14/2022   Migraines    Past Surgical History:  Procedure Laterality Date   KIDNEY SURGERY Left    left side surgery. 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   Patient Active Problem List   Diagnosis Date Noted   Hypothyroidism 05/14/2022   Primary hypertension 05/14/2022   Age-related nuclear cataract of both eyes 03/04/2022   Posterior vitreous detachment of both eyes 03/04/2022   Migraine with aura and without status migrainosus, not intractable 10/15/2021   Diabetes mellitus type 2 in obese [facility])    Erectile dysfunction 10/25/2020   OA (osteoarthritis) of shoulder 12/23/2018    PCP: Shelda Pal, DO   REFERRING PROVIDER: Shelda Pal*   REFERRING DIAG: [phone] (ICD-10-CM) - Cervical stenosis of spine  THERAPY DIAG:  Cervicalgia  Cramp and spasm  Rationale for Evaluation and Treatment: Rehabilitation  ONSET DATE: 2.5 months ago  SUBJECTIVE:                                                                                                                                                                                                         SUBJECTIVE  STATEMENT: Patient reports no pain today.       PERTINENT HISTORY:  HTN, hypothyroidism, chronic L shoulder pain (DJD, RTC tear), T2DM, migraines  PAIN:  Are you having pain? Yes: NPRS scale: 0/10 Pain location: R shoulder/neck  Pain description: arm would get tingling Aggravating factors: laying down at night, laying on side (either) Relieving factors: exercises and stretches  PRECAUTIONS: None  WEIGHT BEARING RESTRICTIONS: No  FALLS:  Has patient fallen in last 6 months? 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 Mild degenerative disc disease at C4-5.   PATIENT SURVEYS:  NDI 9/50 = 18% mild disability  COGNITION: Overall cognitive status: Within functional limits for tasks assessed  SENSATION: WFL  POSTURE: rounded shoulders  PALPATION: Tightness R UT/levator scapulae and scalenes, but no tenderness.    CERVICAL ROM:   Active ROM AROM (deg) eval  Flexion 30  Extension 45  Right lateral flexion 30* twinge  Left lateral flexion 40  Right rotation 70  Left rotation 60   (Blank rows = not tested)  UPPER EXTREMITY ROM:  Active ROM Right eval Left eval  Shoulder flexion 145*tight tingly 145  Shoulder extension    Shoulder abduction 135*tingly, mild pain 150  Shoulder adduction    Shoulder internal rotation Right flank, tight midback  Shoulder external rotation Nape*tingly nape   (Blank rows = not tested)  UPPER EXTREMITY MMT:  MMT Right eval Left eval  Shoulder flexion 5 5  Shoulder extension    Shoulder abduction 5 5  Shoulder adduction    Shoulder internal rotation 5 5  Shoulder  external rotation 5 5  Elbow flexion 5 5  Elbow extension 5 5  Wrist flexion 5 5  Wrist extension 5 5  Grip strength (lbs) 70, 72, 75 65, 62, 65   (Blank rows = not tested)  CERVICAL SPECIAL TESTS:  Spurling's test: Positive  FUNCTIONAL TESTS:  NA  TODAY'S TREATMENT:                                                                                                                              DATE:   06/27/2022 Therapeutic Exercise: to improve strength and mobility.  Demo, verbal and tactile cues throughout for technique. UBE L2.0 8 min Wall push-ups x 10 - noted shoulder clicking but no pain Bridges x 10, tried to progress to single leg bridges but unable to due to HS cramps Prone single leg raises x 10 bil  Bird dogs x 10 bil  Squats x 10  Forward  T-s with counter support x 10 bil - cues for posture Wall clock with Green TB x 5 each side.    06/17/22 Therapeutic Exercise: UBE L1.0 3 min fwd/ 3 min back Standing shoulder extension 2x10 GTB Standing row GTB 2x10 Seated lat pulls 45# 2x10 Standing ER bil with GTB x 15 Seated horizontal ABD chest supported x 12 GTB  Wall push up w/ a plus x 10  Serratus roll on wall w/ foam roll x 10  06/11/22 see patient education  PATIENT EDUCATION:  Education details:  HEP update Person educated: Patient Education method: Explanation Education comprehension: verbalized understanding  HOME EXERCISE PROGRAM: Access Code: [email or web address] URL: [email or web address] Date: 06/27/2022 Prepared by: Glenetta Hew  Exercises - Shoulder External Rotation and Scapular Retraction with Resistance  - 1 x daily - 7 x weekly - 2 sets - 10 reps - Standing Shoulder Row with Anchored Resistance  - 1 x daily - 7 x weekly - 2 sets - 10 reps - Shoulder extension with resistance - Neutral  - 1 x daily - 7 x weekly - 2 sets - 10 reps - Standing Shoulder Horizontal Abduction with Anchored Resistance  - 1 x daily - 7 x weekly - 2 sets - 10 reps -  Wall Push Up  - 1 x daily - 7 x weekly - 3 sets - 10 reps - Supine Bridge  - 1 x daily - 7 x weekly - 3 sets - 10 reps - Beginner Prone Single Leg Raise  - 1 x daily - 7 x weekly - 3 sets - 10 reps - Bird Dog  - 1 x daily - 7 x weekly - 3 sets - 10 reps - Squat  - 1 x daily - 7 x weekly - 3 sets - 10 reps - Wall Clock with Theraband  - 1 x daily - 7 x weekly - 3 sets - 10 reps - Forward T with Counter Support  - 1 x daily - 7 x weekly - 3 sets - 10 reps - Single-Leg [place] Deadlift With Dumbbell  - 1 x daily - 7 x weekly - 3 sets - 10 reps  ASSESSMENT:  CLINICAL IMPRESSION: Nathan Brandt reports complete resolution of neck/R shoulder/RLE pain, tingling and numbness.  He is able to move his neck in all directions without pain and has returned to all activities without limitation.  He has met LTG #2,3 and #5. Today progressed HEP for core/shoulder/back/glute strengthening, educated on importance of regular physical exercise (not just generalized activity) and blood sugar maintenance, and reviewed exercise program that would address all major muscle groups to prevent further injury.   He would like to be placed on 30 day hold today.   OBJECTIVE IMPAIRMENTS: decreased activity tolerance, decreased ROM, decreased strength, increased fascial restrictions, increased muscle spasms, impaired sensation, impaired UE functional use, postural dysfunction, and pain.   ACTIVITY LIMITATIONS: carrying, lifting, bending, sleeping, reach over head, and hygiene/grooming  PARTICIPATION LIMITATIONS: occupation  PERSONAL FACTORS: 1-2 comorbidities: hypothyroidism, L shoulder OA, T2DM, obesity  are also affecting patient's functional outcome.   REHAB POTENTIAL: Good  CLINICAL DECISION MAKING: Stable/uncomplicated  EVALUATION COMPLEXITY: Low   GOALS: Goals reviewed with patient? Yes  SHORT TERM GOALS: Target date: 06/25/2022   Patient will be independent with initial HEP.  Baseline: needs Goal status:  MET 06/27/22  LONG TERM GOALS: Target date: 07/23/2022   Patient will be independent with advanced/ongoing HEP to improve outcomes and carryover.  Baseline: Goal status: IN PROGRESS 06/27/22- given  2.  Patient will report 75% improvement in neck/R shoulder tingling/numbness to improve QOL.  Baseline:  Goal status: MET 06/27/22 - no pain or numbness now.   3.  Patient will demonstrate full pain free cervical ROM for safety with driving.  Baseline: see objective Goal status: MET - no pain, full ROM.   4.  Patient will report no more than 4% impairment on NDI to demonstrate improved functional ability.  Baseline: 18% impairment Goal status: IN PROGRESS  5.  Patient will report 75% improvement in  sleep disruption due to shoulder pain. Baseline:  Goal status: MET 06/27/2022 - no pain at night.   6. Patient will demonstrate full R shoulder ROM without pain = L shoulder ROM for job activities.    Baseline: see objective Goal status: IN PROGRESS    PLAN:  PT FREQUENCY: 1-2x/week  PT DURATION: 6 weeks  PLANNED INTERVENTIONS: Therapeutic exercises, Therapeutic activity, Neuromuscular re-education, Balance training, Gait training, Patient/Family education, Self Care, Joint mobilization, Dry Needling, Electrical stimulation, Spinal mobilization, Cryotherapy, Moist heat, Traction, Ultrasound, Manual therapy, and Re-evaluation  PLAN FOR NEXT SESSION: 30 day hold   Rennie Natter, PT, DPT  06/27/2022, 4:18 PM   PHYSICAL THERAPY DISCHARGE SUMMARY  Visits from Start of Care: 3  Current functional level related to goals / functional outcomes: Complete resolution of neck, R shoulder and RUE pain.     Remaining deficits: See above   Education / Equipment: HEP  Plan: Patient agrees to discharge.  Patient is being discharged due to meeting the stated rehab goals.  Patient was placed on 30 day hold on 06/27/2022 and has not needed to return to therapy within stated time frame.      Rennie Natter, PT, DPT 11:48 AM 08/02/2022

## 2022-06-27 NOTE — Therapy (Incomplete Revision)
OUTPATIENT PHYSICAL THERAPY CERVICAL TREATMENT   Patient Name: Nathan Brandt MRN: [id number] DOB:Jun 27, 1965, 57 y.o., male Today's Date: 06/27/2022  END OF SESSION:  PT End of Session - 06/27/22 1618     Visit Number 3    Number of Visits 12    Date for PT Re-Evaluation 07/23/22    Authorization Type [facility]    PT Start Time 1616    Activity Tolerance Patient tolerated treatment well    Behavior During Therapy [facility] for tasks assessed/performed              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   bilateral shoulders   Diabetes mellitus type 2 in obese ([place])    diet controlled- patient reports he has never been told he had DM- no meds   Hyperlipidemia    diet controlled   Hypertension    on meds   Hypothyroidism 05/14/2022   Migraines    Past Surgical History:  Procedure Laterality Date   KIDNEY SURGERY Left    left side surgery. 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   Patient Active Problem List   Diagnosis Date Noted   Hypothyroidism 05/14/2022   Primary hypertension 05/14/2022   Age-related nuclear cataract of both eyes 03/04/2022   Posterior vitreous detachment of both eyes 03/04/2022   Migraine with aura and without status migrainosus, not intractable 10/15/2021   Diabetes mellitus type 2 in obese [facility])    Erectile dysfunction 10/25/2020   OA (osteoarthritis) of shoulder 12/23/2018    PCP: Shelda Pal, DO   REFERRING PROVIDER: Shelda Pal*   REFERRING DIAG: [phone] (ICD-10-CM) - Cervical stenosis of spine  THERAPY DIAG:  Cervicalgia  Cramp and spasm  Rationale for Evaluation and Treatment: Rehabilitation  ONSET DATE: 2.5 months ago  SUBJECTIVE:                                                                                                                                                                                                         SUBJECTIVE  STATEMENT: Patient reports no pain today.       PERTINENT HISTORY:  HTN, hypothyroidism, chronic L shoulder pain (DJD, RTC tear), T2DM, migraines  PAIN:  Are you having pain? Yes: NPRS scale: 0/10 Pain location: R shoulder/neck  Pain description: arm would get tingling Aggravating factors: laying down at night, laying on side (either) Relieving factors: exercises and stretches  PRECAUTIONS: None  WEIGHT BEARING RESTRICTIONS: No  FALLS:  Has patient fallen in last 6 months? 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 Mild degenerative disc disease at C4-5.   PATIENT SURVEYS:  NDI 9/50 = 18% mild disability  COGNITION: Overall cognitive status: Within functional limits for tasks assessed  SENSATION: WFL  POSTURE: rounded shoulders  PALPATION: Tightness R UT/levator scapulae and scalenes, but no tenderness.    CERVICAL ROM:   Active ROM AROM (deg) eval  Flexion 30  Extension 45  Right lateral flexion 30* twinge  Left lateral flexion 40  Right rotation 70  Left rotation 60   (Blank rows = not tested)  UPPER EXTREMITY ROM:  Active ROM Right eval Left eval  Shoulder flexion 145*tight tingly 145  Shoulder extension    Shoulder abduction 135*tingly, mild pain 150  Shoulder adduction    Shoulder internal rotation Right flank, tight midback  Shoulder external rotation Nape*tingly nape   (Blank rows = not tested)  UPPER EXTREMITY MMT:  MMT Right eval Left eval  Shoulder flexion 5 5  Shoulder extension    Shoulder abduction 5 5  Shoulder adduction    Shoulder internal rotation 5 5  Shoulder  external rotation 5 5  Elbow flexion 5 5  Elbow extension 5 5  Wrist flexion 5 5  Wrist extension 5 5  Grip strength (lbs) 70, 72, 75 65, 62, 65   (Blank rows = not tested)  CERVICAL SPECIAL TESTS:  Spurling's test: Positive  FUNCTIONAL TESTS:  NA  TODAY'S TREATMENT:                                                                                                                              DATE:   06/27/2022 Therapeutic Exercise: to improve strength and mobility.  Demo, verbal and tactile cues throughout for technique. UBE L2.0 8 min Wall push-ups x 10 - noted shoulder clicking but no pain Bridges x 10, tried to progress to single leg bridges but unable to due to HS cramps Prone single leg raises x 10 bil  Bird dogs x 10 bil  Squats x 10  Forward  T-s with counter support x 10 bil - cues for posture Wall clock with Green TB x 5 each side.    06/17/22 Therapeutic Exercise: UBE L1.0 3 min fwd/ 3 min back Standing shoulder extension 2x10 GTB Standing row GTB 2x10 Seated lat pulls 45# 2x10 Standing ER bil with GTB x 15 Seated horizontal ABD chest supported x 12 GTB  Wall push up w/ a plus x 10  Serratus roll on wall w/ foam roll x 10  06/11/22 see patient education  PATIENT EDUCATION:  Education details:  HEP update Person educated: Patient Education method: Explanation Education comprehension: verbalized understanding  HOME EXERCISE PROGRAM: Access Code: [email or web address] URL: [email or web address] Date: 06/27/2022 Prepared by: Glenetta Hew  Exercises - Shoulder External Rotation and Scapular Retraction with Resistance  - 1 x daily - 7 x weekly - 2 sets - 10 reps - Standing Shoulder Row with Anchored Resistance  - 1 x daily - 7 x weekly - 2 sets - 10 reps - Shoulder extension with resistance - Neutral  - 1 x daily - 7 x weekly - 2 sets - 10 reps - Standing Shoulder Horizontal Abduction with Anchored Resistance  - 1 x daily - 7 x weekly - 2 sets - 10 reps -  Wall Push Up  - 1 x daily - 7 x weekly - 3 sets - 10 reps - Supine Bridge  - 1 x daily - 7 x weekly - 3 sets - 10 reps - Beginner Prone Single Leg Raise  - 1 x daily - 7 x weekly - 3 sets - 10 reps - Bird Dog  - 1 x daily - 7 x weekly - 3 sets - 10 reps - Squat  - 1 x daily - 7 x weekly - 3 sets - 10 reps - Wall Clock with Theraband  - 1 x daily - 7 x weekly - 3 sets - 10 reps - Forward T with Counter Support  - 1 x daily - 7 x weekly - 3 sets - 10 reps - Single-Leg [place] Deadlift With Dumbbell  - 1 x daily - 7 x weekly - 3 sets - 10 reps  ASSESSMENT:  CLINICAL IMPRESSION: Nathan Brandt reports complete resolution of neck/R shoulder/RLE pain, tingling and numbness.  He is able to move his neck in all directions without pain and has returned to all activities without limitation.  He has met LTG #2,3 and #5. Today progressed HEP for core/shoulder/back/glute strengthening, educated on importance of regular physical exercise (not just generalized activity) and blood sugar maintenance, and reviewed exercise program that would address all major muscle groups to prevent further injury.   He would like to be placed on 30 day hold today.   OBJECTIVE IMPAIRMENTS: decreased activity tolerance, decreased ROM, decreased strength, increased fascial restrictions, increased muscle spasms, impaired sensation, impaired UE functional use, postural dysfunction, and pain.   ACTIVITY LIMITATIONS: carrying, lifting, bending, sleeping, reach over head, and hygiene/grooming  PARTICIPATION LIMITATIONS: occupation  PERSONAL FACTORS: 1-2 comorbidities: hypothyroidism, L shoulder OA, T2DM, obesity  are also affecting patient's functional outcome.   REHAB POTENTIAL: Good  CLINICAL DECISION MAKING: Stable/uncomplicated  EVALUATION COMPLEXITY: Low   GOALS: Goals reviewed with patient? Yes  SHORT TERM GOALS: Target date: 06/25/2022   Patient will be independent with initial HEP.  Baseline: needs Goal status:  MET 06/27/22  LONG TERM GOALS: Target date: 07/23/2022   Patient will be independent with advanced/ongoing HEP to improve outcomes and carryover.  Baseline: Goal status: IN PROGRESS 06/27/22- given  2.  Patient will report 75% improvement in neck/R shoulder tingling/numbness to improve QOL.  Baseline:  Goal status: MET 06/27/22 - no pain or numbness now.   3.  Patient will demonstrate full pain free cervical ROM for safety with driving.  Baseline: see objective Goal status: MET - no pain, full ROM.   4.  Patient will report no more than 4% impairment on NDI to demonstrate improved functional ability.  Baseline: 18% impairment Goal status: IN PROGRESS  5.  Patient will report 75% improvement in  sleep disruption due to shoulder pain. Baseline:  Goal status: MET 06/27/2022 - no pain at night.   6. Patient will demonstrate full R shoulder ROM without pain = L shoulder ROM for job activities.    Baseline: see objective Goal status: IN PROGRESS    PLAN:  PT FREQUENCY: 1-2x/week  PT DURATION: 6 weeks  PLANNED INTERVENTIONS: Therapeutic exercises, Therapeutic activity, Neuromuscular re-education, Balance training, Gait training, Patient/Family education, Self Care, Joint mobilization, Dry Needling, Electrical stimulation, Spinal mobilization, Cryotherapy, Moist heat, Traction, Ultrasound, Manual therapy, and Re-evaluation  PLAN FOR NEXT SESSION: 30 day hold   Rennie Natter, PT, DPT  06/27/2022, 4:18 PM   PHYSICAL THERAPY DISCHARGE SUMMARY  Visits from Start of Care: ***  Current functional level related to goals / functional outcomes: ***   Remaining deficits: ***   Education / Equipment: ***  Plan: Patient agrees to discharge.  Patient goals were not met. Patient is being discharged due to meeting the stated rehab goals.

## 2022-07-11 ENCOUNTER — Encounter: Payer: 59 | Admitting: Physical Therapy

## 2022-07-15 ENCOUNTER — Encounter: Payer: 59 | Admitting: Physical Therapy

## 2022-07-18 ENCOUNTER — Encounter: Payer: 59 | Admitting: Physical Therapy

## 2022-07-23 ENCOUNTER — Encounter: Payer: 59 | Admitting: Physical Therapy

## 2022-08-23 ENCOUNTER — Other Ambulatory Visit: Payer: Self-pay | Admitting: Family Medicine

## 2022-08-23 ENCOUNTER — Other Ambulatory Visit (HOSPITAL_BASED_OUTPATIENT_CLINIC_OR_DEPARTMENT_OTHER): Payer: Self-pay

## 2022-08-23 MED ORDER — LEVOCETIRIZINE DIHYDROCHLORIDE 5 MG PO TABS
5.0000 mg | ORAL_TABLET | Freq: Every evening | ORAL | 1 refills | Status: DC
  Filled 2022-08-23: qty 90, 90d supply, fill #0
  Filled 2022-11-26: qty 90, 90d supply, fill #1

## 2022-08-26 ENCOUNTER — Other Ambulatory Visit: Payer: Self-pay

## 2022-09-04 ENCOUNTER — Other Ambulatory Visit: Payer: Self-pay | Admitting: Family Medicine

## 2022-09-05 ENCOUNTER — Other Ambulatory Visit (HOSPITAL_BASED_OUTPATIENT_CLINIC_OR_DEPARTMENT_OTHER): Payer: Self-pay

## 2022-09-05 MED ORDER — LOSARTAN POTASSIUM 100 MG PO TABS
100.0000 mg | ORAL_TABLET | Freq: Every day | ORAL | 1 refills | Status: DC
  Filled 2022-09-05: qty 90, 90d supply, fill #0
  Filled 2022-12-17: qty 90, 90d supply, fill #1

## 2022-09-23 ENCOUNTER — Encounter: Payer: Self-pay | Admitting: *Deleted

## 2022-09-25 ENCOUNTER — Ambulatory Visit (INDEPENDENT_AMBULATORY_CARE_PROVIDER_SITE_OTHER): Payer: 59 | Admitting: Family Medicine

## 2022-09-25 ENCOUNTER — Other Ambulatory Visit: Payer: Self-pay | Admitting: Family Medicine

## 2022-09-25 ENCOUNTER — Encounter: Payer: Self-pay | Admitting: Family Medicine

## 2022-09-25 ENCOUNTER — Other Ambulatory Visit (HOSPITAL_BASED_OUTPATIENT_CLINIC_OR_DEPARTMENT_OTHER): Payer: Self-pay

## 2022-09-25 VITALS — BP 122/82 | HR 82 | Temp 98.1°F | Ht 64.0 in | Wt 253.2 lb

## 2022-09-25 DIAGNOSIS — E7800 Pure hypercholesterolemia, unspecified: Secondary | ICD-10-CM

## 2022-09-25 DIAGNOSIS — E669 Obesity, unspecified: Secondary | ICD-10-CM

## 2022-09-25 DIAGNOSIS — E1169 Type 2 diabetes mellitus with other specified complication: Secondary | ICD-10-CM | POA: Diagnosis not present

## 2022-09-25 LAB — LIPID PANEL
Cholesterol: 216 mg/dL — ABNORMAL HIGH (ref 0–200)
HDL: 59.8 mg/dL (ref >39.00)
LDL Cholesterol: 134 mg/dL — ABNORMAL HIGH (ref 0–99)
NonHDL: 156.22
Total CHOL/HDL Ratio: 4
Triglycerides: 109 mg/dL (ref 0.0–149.0)
VLDL: 21.8 mg/dL (ref 0.0–40.0)

## 2022-09-25 LAB — HEMOGLOBIN A1C: Hgb A1c MFr Bld: 6.7 % — ABNORMAL HIGH (ref 4.6–6.5)

## 2022-09-25 MED ORDER — IGLUCOSE TEST STRIPS VI STRP
ORAL_STRIP | 3 refills | Status: DC
  Filled 2022-09-25: qty 100, fill #0

## 2022-09-25 MED ORDER — FREESTYLE LITE W/DEVICE KIT
PACK | 1 refills | Status: DC
  Filled 2022-09-25: qty 1, 30d supply, fill #0

## 2022-09-25 MED ORDER — FREESTYLE LANCETS MISC
3 refills | Status: DC
  Filled 2022-09-25: qty 100, 90d supply, fill #0

## 2022-09-25 MED ORDER — FREESTYLE LITE TEST VI STRP
ORAL_STRIP | 3 refills | Status: DC
  Filled 2022-09-25: qty 50, 50d supply, fill #0

## 2022-09-25 NOTE — Patient Instructions (Addendum)
Give [place] 2-3 business days to get the results of your labs back.   Keep the diet clean and stay active.  Find out the brand of your glucose meter and let us know so we can send in the correct strips.   Let us know if you need anything.

## 2022-09-25 NOTE — Addendum Note (Signed)
Addended by: Scharlene Gloss B on: 09/25/2022 11:38 AM   Modules accepted: Orders

## 2022-09-25 NOTE — Progress Notes (Signed)
Subjective:   Chief Complaint  Patient presents with   Follow-up    Nathan Brandt is a 57 y.o. male here for follow-up of diabetes.   Nathan Brandt does not routinely monitor sugars at home.  Patient does not require insulin.   Medications include: Did not end up taking the Metformin.  Diet is OK.  Has intentionally lost wt decreasing portions and making some changes.  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   Hyperlipidemia Patient presents for dyslipidemia follow up. Currently being treated with nothing (did not fill the Lipitor 20 mg/d) and compliance with treatment thus far has been good. Diet/exercise as above.  The patient is not known to have coexisting coronary artery disease.  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   bilateral shoulders   Diabetes mellitus type 2 in obese    diet controlled- patient reports he has never been told he had DM- no meds   Hyperlipidemia    diet controlled   Hypertension    on meds   Hypothyroidism 05/14/2022   Migraines      Related testing: Retinal exam: Done Pneumovax: done  Objective:  BP 122/82 (BP Location: Left Arm, Patient Position: Sitting, Cuff Size: Large)   Pulse 82   Temp 98.1 F (36.7 C) (Oral)   Ht  (1.626 m)   Wt 253 lb 4 oz (114.9 kg)   SpO2 97%   BMI 43.47 kg/m  General:  Well developed, well nourished, in no apparent distress Skin:  Warm, no pallor or diaphoresis on exposed skin.  Lungs:  CTAB, no access msc use Cardio:  RRR, no bruits, no LE edema Psych: Age appropriate judgment and insight  Assessment:   Type 2 diabetes mellitus with obesity   Plan:   Chronic, hopefully controlled. A1c goal <7, 7.2 3 mo ago. Did not start Metformin XR 500 mg/d but did improve diet. Will see numbers and may add it pending results. Counseled on diet and exercise. He needs strips, does not remember brand of glucometer, will let us know.  Chronic,  unstable. Did not start statin but counseled on reasoning for this and he agrees to start.  F/u in 3-6 mo. The patient voiced understanding and agreement to the plan.  Jilda Roche [place], DO 09/25/22 7:38 AM

## 2022-09-27 ENCOUNTER — Other Ambulatory Visit (HOSPITAL_BASED_OUTPATIENT_CLINIC_OR_DEPARTMENT_OTHER): Payer: Self-pay

## 2022-10-28 ENCOUNTER — Other Ambulatory Visit (HOSPITAL_BASED_OUTPATIENT_CLINIC_OR_DEPARTMENT_OTHER): Payer: Self-pay

## 2022-12-31 ENCOUNTER — Other Ambulatory Visit: Payer: Self-pay | Admitting: Family Medicine

## 2022-12-31 ENCOUNTER — Ambulatory Visit (INDEPENDENT_AMBULATORY_CARE_PROVIDER_SITE_OTHER): Payer: 59 | Admitting: Family Medicine

## 2022-12-31 ENCOUNTER — Encounter: Payer: Self-pay | Admitting: Family Medicine

## 2022-12-31 ENCOUNTER — Other Ambulatory Visit (HOSPITAL_BASED_OUTPATIENT_CLINIC_OR_DEPARTMENT_OTHER): Payer: Self-pay

## 2022-12-31 VITALS — BP 120/84 | HR 75 | Temp 98.7°F | Ht 64.0 in | Wt 253.5 lb

## 2022-12-31 DIAGNOSIS — E1169 Type 2 diabetes mellitus with other specified complication: Secondary | ICD-10-CM

## 2022-12-31 DIAGNOSIS — B353 Tinea pedis: Secondary | ICD-10-CM

## 2022-12-31 DIAGNOSIS — E039 Hypothyroidism, unspecified: Secondary | ICD-10-CM

## 2022-12-31 DIAGNOSIS — Z125 Encounter for screening for malignant neoplasm of prostate: Secondary | ICD-10-CM

## 2022-12-31 DIAGNOSIS — Z0000 Encounter for general adult medical examination without abnormal findings: Secondary | ICD-10-CM | POA: Diagnosis not present

## 2022-12-31 DIAGNOSIS — E669 Obesity, unspecified: Secondary | ICD-10-CM | POA: Diagnosis not present

## 2022-12-31 DIAGNOSIS — R972 Elevated prostate specific antigen [PSA]: Secondary | ICD-10-CM

## 2022-12-31 LAB — COMPREHENSIVE METABOLIC PANEL
ALT: 29 U/L (ref 0–53)
AST: 24 U/L (ref 0–37)
Albumin: 4.1 g/dL (ref 3.5–5.2)
Alkaline Phosphatase: 76 U/L (ref 39–117)
BUN: 17 mg/dL (ref 6–23)
CO2: 27 mEq/L (ref 19–32)
Calcium: 9 mg/dL (ref 8.4–10.5)
Chloride: 104 mEq/L (ref 96–112)
Creatinine, Ser: 1.05 mg/dL (ref 0.40–1.50)
GFR: 79.74 mL/min (ref >60.00)
Glucose, Bld: 186 mg/dL — ABNORMAL HIGH (ref 70–99)
Potassium: 3.8 mEq/L (ref 3.5–5.1)
Sodium: 140 mEq/L (ref 135–145)
Total Bilirubin: 0.5 mg/dL (ref 0.2–1.2)
Total Protein: 6.4 g/dL (ref 6.0–8.3)

## 2022-12-31 LAB — CBC
HCT: 42.4 % (ref 39.0–52.0)
Hemoglobin: 14 g/dL (ref 13.0–17.0)
MCHC: 32.9 g/dL (ref 30.0–36.0)
MCV: 89.4 fl (ref 78.0–100.0)
Platelets: 211 10*3/uL (ref 150.0–400.0)
RBC: 4.74 Mil/uL (ref 4.22–5.81)
RDW: 13.6 % (ref 11.5–15.5)
WBC: 4.7 10*3/uL (ref 4.0–10.5)

## 2022-12-31 LAB — LIPID PANEL
Cholesterol: 160 mg/dL (ref 0–200)
HDL: 56.3 mg/dL (ref >39.00)
LDL Cholesterol: 82 mg/dL (ref 0–99)
NonHDL: 104.12
Total CHOL/HDL Ratio: 3
Triglycerides: 113 mg/dL (ref 0.0–149.0)
VLDL: 22.6 mg/dL (ref 0.0–40.0)

## 2022-12-31 LAB — HEMOGLOBIN A1C: Hgb A1c MFr Bld: 6.7 % — ABNORMAL HIGH (ref 4.6–6.5)

## 2022-12-31 LAB — T4, FREE: Free T4: 0.91 ng/dL (ref 0.60–1.60)

## 2022-12-31 LAB — TSH: TSH: 3.51 u[IU]/mL (ref 0.35–5.50)

## 2022-12-31 LAB — MICROALBUMIN / CREATININE URINE RATIO
Creatinine,U: 91.3 mg/dL
Microalb Creat Ratio: 1.1 mg/g (ref 0.0–30.0)
Microalb, Ur: 1 mg/dL (ref 0.0–1.9)

## 2022-12-31 LAB — PSA: PSA: 2.7 ng/mL (ref 0.10–4.00)

## 2022-12-31 MED ORDER — KETOCONAZOLE 2 % EX CREA
1.0000 | TOPICAL_CREAM | Freq: Every day | CUTANEOUS | 0 refills | Status: AC
Start: 2022-12-31 — End: 2023-02-11
  Filled 2022-12-31: qty 30, 30d supply, fill #0

## 2022-12-31 MED ORDER — ATORVASTATIN CALCIUM 20 MG PO TABS
20.0000 mg | ORAL_TABLET | Freq: Every day | ORAL | 3 refills | Status: AC
  Filled 2022-12-31: qty 30, 30d supply, fill #0
  Filled 2023-01-26: qty 30, 30d supply, fill #1
  Filled 2023-02-26: qty 30, 30d supply, fill #2
  Filled 2023-04-14: qty 30, 30d supply, fill #3

## 2022-12-31 NOTE — Patient Instructions (Addendum)
Give us 2-3 business days to get the results of your labs back.   Keep the diet clean and stay active.  Please get me a copy of your advanced directive form at your convenience.   The Shingrix vaccine (for shingles) is a 2 shot series spaced 2-6 months apart. It can make people feel low energy, achy and almost like they have the flu for 48 hours after injection. 1/5 people can have nausea and/or vomiting. Please plan accordingly when deciding on when to get this shot. Call our office for a nurse visit appointment to get this. The second shot of the series is less severe regarding the side effects, but it still lasts 48 hours.   Let us know if you need anything.  

## 2022-12-31 NOTE — Progress Notes (Signed)
Chief Complaint  Patient presents with   Annual Exam    Well Male Nathan Brandt is here for a complete physical.   His last physical was >1 year ago.  Current diet: in general, diet could be better.  Current exercise: active at work Weight trend: stable Fatigue out of ordinary? No. Seat belt? Yes.   Advanced directive? No  Health maintenance Shingrix- No Colonoscopy- Yes Tetanus- Yes HIV- Yes Hep C- Yes   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   bilateral shoulders   Diabetes mellitus type 2 in obese    diet controlled- patient reports he has never been told he had DM- no meds   Hyperlipidemia    diet controlled   Hypertension    on meds   Hypothyroidism 05/14/2022   Migraines       Past Surgical History:  Procedure Laterality Date   KIDNEY SURGERY Left    left side surgery. 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    Medications  Current Outpatient Medications on File Prior to Visit  Medication Sig Dispense Refill   atorvastatin (LIPITOR) 20 MG tablet Take 1 tablet (20 mg total) by mouth daily. 30 tablet 3   Blood Glucose Monitoring Suppl (FREESTYLE LITE) w/Device KIT Test daily to check blood sugar. 1 kit 1   glucose blood (FREESTYLE LITE) test strip Use daily to check blood sugar.  DX E11.65 100 each 3   Lancets (FREESTYLE) lancets Use daily to check blood sugar.  DX E11.65 100 each 3   levocetirizine (XYZAL) 5 MG tablet Take 1 tablet (5 mg total) by mouth every evening. 90 tablet 1   levothyroxine (SYNTHROID) 50 MCG tablet Take 1 tablet (50 mcg total) by mouth daily. 90 tablet 3   losartan (COZAAR) 100 MG tablet Take 1 tablet (100 mg total) by mouth daily. 90 tablet 1   rizatriptan (MAXALT) 10 MG tablet Take 1 tablet (10 mg total) by mouth as needed for migraine. May repeat in 2 hours if needed 10 tablet 2   Vitamin D, Ergocalciferol, (DRISDOL) 1.25 MG (50000 UNIT) CAPS capsule Take 1 capsule (50,000  Units total) by mouth every 7 (seven) days. 12 capsule 0   Allergies Allergies  Allergen Reactions   Prednisone Anaphylaxis    uvula swollen   Tizanidine Swelling    Sensation of throat swelling    Family History Family History  Problem Relation Age of Onset   Cancer Mother    Diabetes Father    Colon cancer Neg Hx    Colon polyps Neg Hx    Esophageal cancer Neg Hx    Stomach cancer Neg Hx    Rectal cancer Neg Hx     Review of Systems: Constitutional:  no fevers Eye:  no recent significant change in vision Ear/Nose/Mouth/Throat:  Ears:  no hearing loss Nose/Mouth/Throat:  no complaints of nasal congestion, no sore throat Cardiovascular:  no chest pain Respiratory:  no shortness of breath Gastrointestinal:  no change in bowel habits GU:  Male: negative for dysuria, frequency Musculoskeletal/Extremities:  no new joint pain Integumentary (Skin/Breast):  no abnormal skin lesions reported Neurologic:  no headaches Endocrine: No unexpected weight changes Hematologic/Lymphatic:  no abnormal bleeding  Exam BP 120/84 (BP Location: Left Arm, Patient Position: Sitting, Cuff Size: Large)   Pulse 75   Temp 98.7 F (37.1 C) (Oral)   Ht 5\' 4"  (1.626 m)   Wt 253 lb 8 oz (115 kg)  SpO2 95%   BMI 43.51 kg/m  General:  well developed, well nourished, in no apparent distress Skin: macerated tiss b/t 4/5th digit on R foot; otherwise no significant moles, warts, or growths Head:  no masses, lesions, or tenderness Eyes:  pupils equal and round, sclera anicteric without injection Ears:  canals without lesions, TMs shiny without retraction, no obvious effusion, no erythema Nose:  nares patent, mucosa normal Throat/Pharynx:  lips and gingiva without lesion; tongue and uvula midline; non-inflamed pharynx; no exudates or postnasal drainage Neck: neck supple without adenopathy, thyromegaly, or masses Cardiac: RRR, no bruits, no LE edema Lungs:  clear to auscultation, breath sounds equal  bilaterally, no respiratory distress Abdomen: BS+, soft, non-tender, non-distended, no masses or organomegaly noted Rectal: Deferred Musculoskeletal:  symmetrical muscle groups noted without atrophy or deformity Neuro:  gait normal; deep tendon reflexes normal and symmetric Psych: well oriented with normal range of affect and appropriate judgment/insight  Assessment and Plan  Well adult exam - Plan: CBC, Comprehensive metabolic panel, Lipid panel  Type 2 diabetes mellitus with obesity (HCC) - Plan: Hemoglobin A1c, Microalbumin / creatinine urine ratio  Hypothyroidism, unspecified type - Plan: T4, free, TSH  Screening for prostate cancer - Plan: PSA  Tinea pedis of right foot - Plan: ketoconazole (NIZORAL) 2 % cream   Well 57 y.o. male. Counseled on diet and exercise. Counseled on risks and benefits of prostate cancer screening with PSA. The patient agrees to undergo testing. Shingrix rec'd.  Advanced directive form provided today.  Immunizations, labs, and further orders as above. Follow up in 3-6 mo pending results. The patient voiced understanding and agreement to the plan.  Jilda Roche [place], DO 12/31/22 7:40 AM

## 2023-02-08 ENCOUNTER — Ambulatory Visit
Admission: RE | Admit: 2023-02-08 | Discharge: 2023-02-08 | Disposition: A | Discharge status: Home / Self Care | Payer: 59 | Source: Ambulatory Visit | Attending: Family Medicine | Admitting: Family Medicine

## 2023-02-08 VITALS — BP 157/92 | HR 63 | Temp 97.5°F | Resp 18 | Ht 64.0 in | Wt 248.0 lb

## 2023-02-08 DIAGNOSIS — L84 Corns and callosities: Secondary | ICD-10-CM

## 2023-02-08 NOTE — Discharge Instructions (Signed)
Need to see podiatry

## 2023-02-08 NOTE — ED Provider Notes (Addendum)
[facility]    CSN: [id number] Arrival date & time: 02/08/23  1139      History   Chief Complaint Chief Complaint  Patient presents with   Foot Pain    Left foot pain on bottom of foot - Entered by patient    HPI Nathan Brandt is a 57 y.o. male.   HPI  Patient has pain in his left foot.  Its on the bottom of the foot.  It is worse with weightbearing.  Not present at rest.  It feels better with supportive sneakers.  Hurts to walk on hardwood floors.  No trauma.  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   bilateral shoulders   Diabetes mellitus type 2 in obese    diet controlled- patient reports he has never been told he had DM- no meds   Hyperlipidemia    diet controlled   Hypertension    on meds   Hypothyroidism 05/14/2022   Migraines     Patient Active Problem List   Diagnosis Date Noted   Pure hypercholesterolemia 09/25/2022   Hypothyroidism 05/14/2022   Primary hypertension 05/14/2022   Age-related nuclear cataract of both eyes 03/04/2022   Posterior vitreous detachment of both eyes 03/04/2022   Migraine with aura and without status migrainosus, not intractable 10/15/2021   Type 2 diabetes mellitus with obesity ([facility]CC)    Erectile dysfunction 10/25/2020   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       Home Medications    Prior to Admission medications   Medication Sig Start Date End Date Taking? Authorizing Provider  atorvastatin (LIPITOR) 20 MG tablet Take 1 tablet (20 mg total) by mouth daily. 12/31/22  Yes Sharlene Dory, DO  Blood Glucose Monitoring Suppl (FREESTYLE LITE) w/Device KIT Test daily to check blood sugar. 09/25/22  Yes Sharlene Dory, DO  glucose blood (FREESTYLE LITE) test strip Use daily to check blood sugar.  DX  E11.65 09/25/22  Yes Sharlene Dory, DO  ketoconazole (NIZORAL) 2 % cream Apply 1 Application topically daily. 12/31/22 02/11/23 Yes [place], Jilda Roche, DO  Lancets (FREESTYLE) lancets Use daily to check blood sugar.  DX E11.65 09/25/22  Yes Sharlene Dory, DO  levocetirizine (XYZAL) 5 MG tablet Take 1 tablet (5 mg total) by mouth every evening. 08/23/22  Yes Sharlene Dory, DO  levothyroxine (SYNTHROID) 50 MCG tablet Take 1 tablet (50 mcg total) by mouth daily. 05/15/22  Yes [place], Jilda Roche, DO  losartan (COZAAR) 100 MG tablet Take 1 tablet (100 mg total) by mouth daily. 09/05/22  Yes Sharlene Dory, DO  rizatriptan (MAXALT) 10 MG tablet Take 1 tablet (10 mg total) by mouth as needed for migraine. May repeat in 2 hours if needed 12/25/21  Yes [place], Jilda Roche, DO  Vitamin D, Ergocalciferol, (DRISDOL) 1.25 MG (50000 UNIT) CAPS capsule Take 1 capsule (50,000 Units total) by mouth every 7 (seven) days. 06/25/22  Yes Sharlene Dory, DO    Family History Family History  Problem Relation Age of Onset   Cancer Mother    Diabetes Father    Colon cancer Neg Hx    Colon polyps Neg Hx    Esophageal cancer Neg Hx    Stomach cancer Neg Hx    Rectal cancer Neg Hx  Social History Social History   Tobacco Use   Smoking status: Never   Smokeless tobacco: Never  Vaping Use   Vaping status: Never Used  Substance Use Topics   Alcohol use: Not Currently    Comment: 2 glasses of wine every 3 months.   Drug use: Never     Allergies   Prednisone and Tizanidine   Review of Systems Review of Systems See HPI  Physical Exam Triage Vital Signs ED Triage Vitals  Encounter Vitals Group     BP 02/08/23 1204 (!) 157/92     Systolic BP Percentile --      Diastolic BP Percentile --      Pulse Rate 02/08/23 1204 63     Resp 02/08/23 1204 18     Temp 02/08/23 1204 (!) 97.5 F (36.4 C)     Temp Source 02/08/23 1204 Oral     SpO2 02/08/23  1204 97 %     Weight 02/08/23 1202 248 lb (112.5 kg)     Height 02/08/23 1202 5\' 4"  (1.626 m)     Head Circumference --      Peak Flow --      Pain Score 02/08/23 1202 7     Pain Loc --      Pain Education --      Exclude from Growth Chart --    No data found.  Updated Vital Signs BP (!) 157/92 (BP Location: Left Arm)   Pulse 63   Temp (!) 97.5 F (36.4 C) (Oral)   Resp 18   Ht 5\' 4"  (1.626 m)   Wt 112.5 kg   SpO2 97%   BMI 42.57 kg/m    Physical Exam Constitutional:      General: He is not in acute distress.    Appearance: He is well-developed. He is obese.  HENT:     Head: Normocephalic and atraumatic.  Eyes:     Conjunctiva/sclera: Conjunctivae normal.     Pupils: Pupils are equal, round, and reactive to light.  Cardiovascular:     Rate and Rhythm: Normal rate.  Pulmonary:     Effort: Pulmonary effort is normal. No respiratory distress.  Musculoskeletal:        General: Normal range of motion.     Cervical back: Normal range of motion.       Feet:  Skin:    General: Skin is warm and dry.  Neurological:     Mental Status: He is alert.     Gait: Gait normal.      UC Treatments / Results  Labs (all labs ordered are listed, but only abnormal results are displayed) Labs Reviewed - No data to display  EKG   Radiology No results found.  Procedures Procedures (including critical care time)  Medications Ordered in UC Medications - No data to display  Initial Impression / Assessment and Plan / UC Course  I have reviewed the triage vital signs and the nursing notes.  Pertinent labs & imaging results that were available during my care of the patient were reviewed by me and considered in my medical decision making (see chart for details).     Discussed that this has similar appearance of a plantar wart but usually there is more surface findings.  Possibly callus.  Patient agrees to see podiatrist for further evaluation Final Clinical Impressions(s) /  UC Diagnoses   Final diagnoses:  Plantar callosity     Discharge Instructions      Need to  see podiatry    ED Prescriptions   None    PDMP not reviewed this encounter.   Eustace Moore, MD 02/08/23 1315    Eustace Moore, MD 02/08/23 [phone]

## 2023-02-08 NOTE — ED Triage Notes (Signed)
Pt states that he has some left foot pain. Pt states that he feels a lump underneath his foot. X2 weeks

## 2023-02-11 ENCOUNTER — Other Ambulatory Visit (INDEPENDENT_AMBULATORY_CARE_PROVIDER_SITE_OTHER): Payer: 59

## 2023-02-11 DIAGNOSIS — R972 Elevated prostate specific antigen [PSA]: Secondary | ICD-10-CM

## 2023-02-12 ENCOUNTER — Other Ambulatory Visit: Payer: Self-pay | Admitting: Family Medicine

## 2023-02-12 DIAGNOSIS — R972 Elevated prostate specific antigen [PSA]: Secondary | ICD-10-CM

## 2023-02-12 LAB — PSA: PSA: 2.24 ng/mL (ref 0.10–4.00)

## 2023-02-14 NOTE — Addendum Note (Signed)
Addended by: Scharlene Gloss B on: 02/14/2023 08:54 AM   Modules accepted: Orders

## 2023-02-20 ENCOUNTER — Ambulatory Visit (INDEPENDENT_AMBULATORY_CARE_PROVIDER_SITE_OTHER): Payer: 59

## 2023-02-20 ENCOUNTER — Other Ambulatory Visit: Payer: Self-pay

## 2023-02-20 ENCOUNTER — Encounter: Payer: Self-pay | Admitting: Podiatry

## 2023-02-20 ENCOUNTER — Ambulatory Visit: Payer: 59 | Admitting: Podiatry

## 2023-02-20 DIAGNOSIS — L97422 Non-pressure chronic ulcer of left heel and midfoot with fat layer exposed: Secondary | ICD-10-CM

## 2023-02-20 DIAGNOSIS — M79671 Pain in right foot: Secondary | ICD-10-CM

## 2023-02-20 DIAGNOSIS — E11621 Type 2 diabetes mellitus with foot ulcer: Secondary | ICD-10-CM | POA: Diagnosis not present

## 2023-02-20 DIAGNOSIS — M79672 Pain in left foot: Secondary | ICD-10-CM

## 2023-02-20 DIAGNOSIS — Z0189 Encounter for other specified special examinations: Secondary | ICD-10-CM

## 2023-02-20 DIAGNOSIS — L923 Foreign body granuloma of the skin and subcutaneous tissue: Secondary | ICD-10-CM | POA: Diagnosis not present

## 2023-02-20 DIAGNOSIS — M19071 Primary osteoarthritis, right ankle and foot: Secondary | ICD-10-CM | POA: Diagnosis not present

## 2023-02-20 DIAGNOSIS — M19072 Primary osteoarthritis, left ankle and foot: Secondary | ICD-10-CM | POA: Diagnosis not present

## 2023-02-20 NOTE — Progress Notes (Signed)
  Subjective:  Patient ID: Nathan Brandt, male    DOB: 24-Feb-1966,   MRN: [id number]  Chief Complaint  Patient presents with   Plantar Warts    Pt presents for plantar warts on bottom of his left foot hurts when he walks.    57 y.o. male presents for concern of left foot pain that has been on going for a couples weeks. Was seen in urgent care and advised to follow-up. Has been painful with ambulation. Does not recall stepping on anything.  Patient is diabetic and last A1c was  Lab Results  Component Value Date   HGBA1C 6.7 (H) 12/31/2022   .   PCP:  Sharlene Dory, DO     . Denies any other pedal complaints. Denies n/v/f/c.   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   bilateral shoulders   Diabetes mellitus type 2 in obese    diet controlled- patient reports he has never been told he had DM- no meds   Hyperlipidemia    diet controlled   Hypertension    on meds   Hypothyroidism 05/14/2022   Migraines     Objective:  Physical Exam: Vascular: DP/PT pulses 2/4 bilateral. CFT <3 seconds. Normal hair growth on digits. No edema.  Skin. No lacerations or abrasions bilateral feet. Hyperkeratotic area noted to plantar fifth metatarsal area. Upon debridement notable granulomatous tissue and mild purulence underlying ulceration noted about 0.1 cm x 0.1cm x 0.3 cm . No erythema edema and no probe to bone. Possible small piece of glass removed but difficult to tell.  Musculoskeletal: MMT 5/5 bilateral lower extremities in DF, PF, Inversion and Eversion. Deceased ROM in DF of ankle joint.  Neurological: Sensation intact to light touch.   Assessment:   1. Diabetic ulcer of left midfoot associated with type 2 diabetes mellitus, with fat layer exposed (HCC)   2. Foreign body granuloma of skin and subcutaneous tissue      Plan:  Patient was evaluated and treated and all questions answered. Ulcer plantar fifth metatarsal head area  with fat layer exposed and possible foreign body removed.  -X-rays reviewed. No acute fractures or dislocations no foreign bodies noted.  -Debridement as below. -Dressed with betadine, DSD. -Off-loading with surgical shoe. -No abx indicated.  -Discussed glucose control and proper protein-rich diet.  -Discussed if any worsening redness, pain, fever or chills to call or may need to report to the emergency room. Patient expressed understanding.   Procedure: Excisional Debridement of Wound Rationale: Removal of non-viable soft tissue from the wound to promote healing. Possible piece of glass removed from skin underlying hyperkeratosis.  Anesthesia: none Pre-Debridement Wound Measurements: Overlying callus  Post-Debridement Wound Measurements: 0.1 cm x 0.1 cm x 0.3 cm  Type of Debridement: Sharp Excisional Tissue Removed: Non-viable soft tissue Depth of Debridement: subcutaneous tissue. Technique: Sharp excisional debridement to bleeding, viable wound base.  Dressing: Dry, sterile, compression dressing. Disposition: Patient tolerated procedure well. Patient to return in 2 week for follow-up.  Return in about 2 weeks (around 03/06/2023) for wound check.   Louann Sjogren, DPM

## 2023-02-26 ENCOUNTER — Other Ambulatory Visit: Payer: Self-pay | Admitting: Family Medicine

## 2023-02-27 ENCOUNTER — Other Ambulatory Visit: Payer: Self-pay

## 2023-02-27 ENCOUNTER — Other Ambulatory Visit (HOSPITAL_BASED_OUTPATIENT_CLINIC_OR_DEPARTMENT_OTHER): Payer: Self-pay

## 2023-02-27 MED ORDER — LEVOCETIRIZINE DIHYDROCHLORIDE 5 MG PO TABS
5.0000 mg | ORAL_TABLET | Freq: Every evening | ORAL | 1 refills | Status: AC
  Filled 2023-02-27: qty 90, 90d supply, fill #0
  Filled 2023-06-02: qty 90, 90d supply, fill #1

## 2023-03-05 ENCOUNTER — Other Ambulatory Visit (HOSPITAL_BASED_OUTPATIENT_CLINIC_OR_DEPARTMENT_OTHER): Payer: Self-pay

## 2023-03-05 ENCOUNTER — Ambulatory Visit
Admission: RE | Admit: 2023-03-05 | Discharge: 2023-03-05 | Disposition: A | Discharge status: Home / Self Care | Payer: 59 | Source: Ambulatory Visit | Attending: Family Medicine | Admitting: Family Medicine

## 2023-03-05 ENCOUNTER — Ambulatory Visit: Payer: 59

## 2023-03-05 VITALS — BP 138/80 | HR 78 | Temp 98.3°F | Resp 17

## 2023-03-05 DIAGNOSIS — M25561 Pain in right knee: Secondary | ICD-10-CM | POA: Diagnosis not present

## 2023-03-05 DIAGNOSIS — M25562 Pain in left knee: Secondary | ICD-10-CM

## 2023-03-05 DIAGNOSIS — S86911A Strain of unspecified muscle(s) and tendon(s) at lower leg level, right leg, initial encounter: Secondary | ICD-10-CM

## 2023-03-05 MED ORDER — CELECOXIB 200 MG PO CAPS
200.0000 mg | ORAL_CAPSULE | Freq: Every day | ORAL | 0 refills | Status: AC
  Filled 2023-03-05: qty 15, 15d supply, fill #0

## 2023-03-05 NOTE — ED Provider Notes (Signed)
[facility]    CSN: [id number] Arrival date & time: 03/05/23  1405      History   Chief Complaint Chief Complaint  Patient presents with   Knee Pain    LT    HPI Nathan Brandt is a 57 y.o. male.   HPI 57 year old male presents with left knee pain for 10 days.  Denies injury or insult to the knee.  PMH significant for obesity, T2DM, HTN, and HLD.  Patient reports not taking any OTC remedies for his left knee pain.  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   bilateral shoulders   Diabetes mellitus type 2 in obese    diet controlled- patient reports he has never been told he had DM- no meds   Hyperlipidemia    diet controlled   Hypertension    on meds   Hypothyroidism 05/14/2022   Migraines     Patient Active Problem List   Diagnosis Date Noted   Pure hypercholesterolemia 09/25/2022   Hypothyroidism 05/14/2022   Primary hypertension 05/14/2022   Age-related nuclear cataract of both eyes 03/04/2022   Posterior vitreous detachment of both eyes 03/04/2022   Migraine with aura and without status migrainosus, not intractable 10/15/2021   Type 2 diabetes mellitus with obesity (HCC)    Erectile dysfunction 10/25/2020   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       Home Medications    Prior to Admission medications   Medication Sig Start Date End Date Taking? Authorizing Provider  celecoxib (CELEBREX) 200 MG capsule Take 1 capsule (200 mg total) by mouth daily for 15 days. 03/05/23 03/20/23 Yes Trevor Iha, FNP  atorvastatin (LIPITOR) 20 MG tablet Take 1 tablet (20 mg total) by mouth daily. 12/31/22   Sharlene Dory, DO  Blood Glucose Monitoring Suppl (FREESTYLE LITE) w/Device KIT Test daily to check blood sugar. 09/25/22   Sharlene Dory, DO   glucose blood (FREESTYLE LITE) test strip Use daily to check blood sugar.  DX E11.65 09/25/22   Sharlene Dory, DO  Lancets (FREESTYLE) lancets Use daily to check blood sugar.  DX E11.65 09/25/22   Sharlene Dory, DO  levocetirizine (XYZAL) 5 MG tablet Take 1 tablet (5 mg total) by mouth every evening. 02/27/23   Sharlene Dory, DO  levothyroxine (SYNTHROID) 50 MCG tablet Take 1 tablet (50 mcg total) by mouth daily. 05/15/22   Sharlene Dory, DO  losartan (COZAAR) 100 MG tablet Take 1 tablet (100 mg total) by mouth daily. 09/05/22   Sharlene Dory, DO  rizatriptan (MAXALT) 10 MG tablet Take 1 tablet (10 mg total) by mouth as needed for migraine. May repeat in 2 hours if needed 12/25/21   Sharlene Dory, DO  Vitamin D, Ergocalciferol, (DRISDOL) 1.25 MG (50000 UNIT) CAPS capsule Take 1 capsule (50,000 Units total) by mouth every 7 (seven) days. 06/25/22   Sharlene Dory, DO    Family History Family History  Problem Relation Age of Onset   Cancer Mother    Diabetes Father    Colon cancer Neg Hx    Colon polyps Neg Hx    Esophageal cancer Neg Hx    Stomach cancer Neg Hx    Rectal cancer Neg Hx     Social History Social History  Tobacco Use   Smoking status: Never   Smokeless tobacco: Never  Vaping Use   Vaping status: Never Used  Substance Use Topics   Alcohol use: Not Currently    Comment: 2 glasses of wine every 3 months.   Drug use: Never     Allergies   Prednisone and Tizanidine   Review of Systems Review of Systems  Musculoskeletal:        Left knee pain x 10 days     Physical Exam Triage Vital Signs ED Triage Vitals  Encounter Vitals Group     BP      Systolic BP Percentile      Diastolic BP Percentile      Pulse      Resp      Temp      Temp src      SpO2      Weight      Height      Head Circumference      Peak Flow      Pain Score      Pain Loc      Pain Education      Exclude from Growth  Chart    No data found.  Updated Vital Signs BP 138/80 (BP Location: Right Arm)   Pulse 78   Temp 98.3 F (36.8 C) (Oral)   Resp 17   SpO2 98%        Physical Exam Vitals and nursing note reviewed.  Constitutional:      Appearance: Normal appearance. He is obese.  HENT:     Head: Normocephalic and atraumatic.     Mouth/Throat:     Mouth: Mucous membranes are moist.     Pharynx: Oropharynx is clear.  Eyes:     Extraocular Movements: Extraocular movements intact.     Conjunctiva/sclera: Conjunctivae normal.     Pupils: Pupils are equal, round, and reactive to light.  Cardiovascular:     Rate and Rhythm: Normal rate and regular rhythm.     Pulses: Normal pulses.     Heart sounds: Normal heart sounds. No murmur heard. Pulmonary:     Effort: Pulmonary effort is normal.     Breath sounds: Normal breath sounds. No wheezing, rhonchi or rales.  Musculoskeletal:        General: Normal range of motion.     Cervical back: Normal range of motion and neck supple.     Comments: Left knee (anterior aspect) TTP over medial inferior aspect of patella, no soft tissue swelling, no deformity full range of motion with flexion/extension, patient does have slight antalgic gait favoring left knee on exam today  Skin:    General: Skin is warm and dry.  Neurological:     General: No focal deficit present.     Mental Status: He is alert and oriented to person, place, and time.     Gait: Gait abnormal.  Psychiatric:        Mood and Affect: Mood normal.        Behavior: Behavior normal.        Thought Content: Thought content normal.      UC Treatments / Results  Labs (all labs ordered are listed, but only abnormal results are displayed) Labs Reviewed - No data to display  EKG   Radiology DG Knee Complete 4 Views Left  Result Date: 03/05/2023 CLINICAL DATA:  Left knee pain for 2 weeks without known injury. EXAM: LEFT KNEE - COMPLETE 4+ VIEW COMPARISON:  None Available. FINDINGS: No  evidence of fracture, dislocation, or joint effusion. No evidence of arthropathy or other focal bone abnormality. Soft tissues are unremarkable. IMPRESSION: Negative. Electronically Signed   By: Lupita Raider M.D.   On: 03/05/2023 15:52    Procedures Procedures (including critical care time)  Medications Ordered in UC Medications - No data to display  Initial Impression / Assessment and Plan / UC Course  I have reviewed the triage vital signs and the nursing notes.  Pertinent labs & imaging results that were available during my care of the patient were reviewed by me and considered in my medical decision making (see chart for details).     MDM: 1.  Acute pain of right knee-right knee x-ray results revealed above, Rx'd Celebrex 200 mg capsule: Take 1 capsule daily x 15 days; 2.  Knee strain, right, initial encounter-Rx'd Celebrex 200 mg capsule: Take 1 capsule daily x 15 days. Advised patient of left knee x-ray results with hardcopy/images provided advised patient to take medication as directed with food to completion.  Encouraged to increase daily water intake to 64 ounces per day while taking this medication.  Advised if symptoms worsen and/or unresolved please follow-up with PCP, [facility] orthopedic provider or here for further evaluation.  Patient discharged home, hemodynamically stable.  Final Clinical Impressions(s) / UC Diagnoses   Final diagnoses:  Acute pain of right knee  Knee strain, right, initial encounter     Discharge Instructions      Advised patient of left knee x-ray results with hardcopy/images provided advised patient to take medication as directed with food to completion.  Encouraged to increase daily water intake to 64 ounces per day while taking this medication.  Advised if symptoms worsen and/or unresolved please follow-up with PCP, [facility] orthopedic provider or here for further evaluation.     ED Prescriptions     Medication Sig Dispense Auth.  Provider   celecoxib (CELEBREX) 200 MG capsule Take 1 capsule (200 mg total) by mouth daily for 15 days. 15 capsule Trevor Iha, FNP      PDMP not reviewed this encounter.   Trevor Iha, FNP 03/05/23 1712

## 2023-03-05 NOTE — ED Triage Notes (Signed)
Pt c/o LT knee pain x 10 days. Denies injury. Some swelling. Was seen a month ago for LT foot pain, not sure if this knee pain is a result.

## 2023-03-05 NOTE — Discharge Instructions (Addendum)
Advised patient of left knee x-ray results with hardcopy/images provided advised patient to take medication as directed with food to completion.  Encouraged to increase daily water intake to 64 ounces per day while taking this medication.  Advised if symptoms worsen and/or unresolved please follow-up with PCP, [facility] orthopedic provider or here for further evaluation.

## 2023-03-06 ENCOUNTER — Encounter (INDEPENDENT_AMBULATORY_CARE_PROVIDER_SITE_OTHER): Payer: 59 | Admitting: Ophthalmology

## 2023-03-06 ENCOUNTER — Encounter (INDEPENDENT_AMBULATORY_CARE_PROVIDER_SITE_OTHER): Payer: Self-pay

## 2023-03-06 ENCOUNTER — Ambulatory Visit: Payer: 59 | Admitting: Podiatry

## 2023-03-06 DIAGNOSIS — H2513 Age-related nuclear cataract, bilateral: Secondary | ICD-10-CM | POA: Diagnosis not present

## 2023-03-06 DIAGNOSIS — H43813 Vitreous degeneration, bilateral: Secondary | ICD-10-CM | POA: Diagnosis not present

## 2023-03-06 DIAGNOSIS — E119 Type 2 diabetes mellitus without complications: Secondary | ICD-10-CM | POA: Diagnosis not present

## 2023-03-12 ENCOUNTER — Ambulatory Visit: Payer: 59 | Admitting: Sports Medicine

## 2023-03-12 ENCOUNTER — Encounter: Payer: Self-pay | Admitting: Sports Medicine

## 2023-03-12 VITALS — BP 138/88 | Ht 64.0 in | Wt 248.0 lb

## 2023-03-12 DIAGNOSIS — M25562 Pain in left knee: Secondary | ICD-10-CM | POA: Diagnosis not present

## 2023-03-12 NOTE — Progress Notes (Signed)
   Subjective:    Patient ID: Nathan Brandt, male    DOB: 12-03-1965, 57 y.o.   MRN: [id number]  HPI chief complaint: Left knee pain  Patient is a very pleasant 57 year old male that presents today with approximately 2 weeks of left knee pain.  Pain began without any trauma.  He localizes the pain diffusely around the anterior knee.  He was seen on September 25 at a local urgent care.  X-rays were obtained which are available for my review.  He was placed on Celebrex which has not been beneficial.  He endorses a painful pop when twisting on the knee.  He also endorses pain with prolonged walking and stiffness.  He believes the knee is a little more swollen.  He denies significant injury to this knee in the past.  No prior knee surgeries.  No pain at rest.  Past medical history reviewed Medications reviewed Allergies reviewed   Review of Systems As above    Objective:   Physical Exam  Well-developed, well-nourished.  No acute distress  Left knee: Range of motion 0 to 130 degrees.  Trace effusion.  Slight tenderness to palpation on the medial joint line but a positive Thessaly's.  No tenderness along the lateral joint line.  Knee is stable to valgus and varus stressing.  Negative anterior drawer, negative posterior drawer neurovascularly intact distally.  Walks with a slight limp.  X-rays of the left knee from September 25 including AP, lateral, and oblique views show nothing acute and no significant degenerative changes.  Please note that these are nonweightbearing films.      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  We did discuss merits of a cortisone injection down the road if symptoms persist.  I have also recommended that he discontinue Celebrex  and use ibuprofen as needed.  Ibuprofen has been helpful for other aches and pains for him in the past.  He will follow-up as needed.  This note was dictated using Dragon naturally speaking software and may contain errors in syntax, spelling, or content which have not been identified prior to signing this note.

## 2023-03-21 ENCOUNTER — Other Ambulatory Visit: Payer: Self-pay | Admitting: Family Medicine

## 2023-03-24 ENCOUNTER — Other Ambulatory Visit (HOSPITAL_BASED_OUTPATIENT_CLINIC_OR_DEPARTMENT_OTHER): Payer: Self-pay

## 2023-03-24 MED ORDER — LOSARTAN POTASSIUM 100 MG PO TABS
100.0000 mg | ORAL_TABLET | Freq: Every day | ORAL | 1 refills | Status: AC
  Filled 2023-03-24: qty 90, 90d supply, fill #0

## 2023-05-16 ENCOUNTER — Other Ambulatory Visit (INDEPENDENT_AMBULATORY_CARE_PROVIDER_SITE_OTHER): Payer: 59

## 2023-05-16 ENCOUNTER — Other Ambulatory Visit: Payer: Self-pay

## 2023-05-16 DIAGNOSIS — R972 Elevated prostate specific antigen [PSA]: Secondary | ICD-10-CM | POA: Diagnosis not present

## 2023-05-16 LAB — PSA: PSA: 3.01 ng/mL (ref 0.10–4.00)

## 2023-06-02 ENCOUNTER — Other Ambulatory Visit (HOSPITAL_BASED_OUTPATIENT_CLINIC_OR_DEPARTMENT_OTHER): Payer: Self-pay

## 2023-06-12 ENCOUNTER — Other Ambulatory Visit (HOSPITAL_BASED_OUTPATIENT_CLINIC_OR_DEPARTMENT_OTHER): Payer: Self-pay

## 2023-06-12 ENCOUNTER — Ambulatory Visit: Payer: 59 | Admitting: Urology

## 2023-06-12 ENCOUNTER — Encounter: Payer: Self-pay | Admitting: Urology

## 2023-06-12 VITALS — BP 172/91 | HR 84 | Ht 64.0 in | Wt 261.0 lb

## 2023-06-12 DIAGNOSIS — R399 Unspecified symptoms and signs involving the genitourinary system: Secondary | ICD-10-CM | POA: Diagnosis not present

## 2023-06-12 DIAGNOSIS — R972 Elevated prostate specific antigen [PSA]: Secondary | ICD-10-CM | POA: Diagnosis not present

## 2023-06-12 LAB — URINALYSIS, ROUTINE W REFLEX MICROSCOPIC
Bilirubin, UA: NEGATIVE
Glucose, UA: NEGATIVE
Ketones, UA: NEGATIVE
Leukocytes,UA: NEGATIVE
Nitrite, UA: NEGATIVE
Protein,UA: NEGATIVE
RBC, UA: NEGATIVE
Specific Gravity, UA: 1.02 (ref 1.005–1.030)
Urobilinogen, Ur: 0.2 mg/dL (ref 0.2–1.0)
pH, UA: 6 (ref 5.0–7.5)

## 2023-06-12 MED ORDER — TAMSULOSIN HCL 0.4 MG PO CAPS
0.4000 mg | ORAL_CAPSULE | Freq: Every day | ORAL | 3 refills | Status: AC
Start: 2023-06-12 — End: ?
  Filled 2023-06-12: qty 90, 90d supply, fill #0

## 2023-06-12 NOTE — Progress Notes (Signed)
 Assessment: 1. Elevated PSA   2. Lower urinary tract symptoms (LUTS)      Plan: Today I had a long discussion with the patient regarding his significant lower urinary tract symptoms as well as fluctuating PSA.  We discussed options for further evaluation and treatment.  At this time he would like to begin new medical therapy. Rx: Tamsulosin  0.4 mg daily Nature of medication including proper utilization as well as potential adverse events and side effects reviewed Patient will return for 6 months for recheck with PSA prior to visit  Chief Complaint: psa up  History of Present Illness:  Nathan Brandt is a 58 y.o. male who is seen in consultation from Chambers, DO for evaluation of LUTS and elevated psa.  See psa data below. Patient moved to area 7 yrs ago [place] .  No fam hx of CaP or prostate disease. Reports progressive LUTS past few years and noticeably past 16mo.  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    Allergies:  Allergies  Allergen Reactions   Prednisone  Anaphylaxis    uvula swollen   Tizanidine  Swelling    Sensation of throat swelling    Family History:  Family History  Problem Relation Age of Onset   Cancer Mother    Diabetes Father    Colon cancer Neg Hx    Colon polyps Neg Hx    Esophageal cancer Neg Hx    Stomach cancer  Neg Hx    Rectal cancer Neg Hx     Social History:  Social History   Tobacco Use   Smoking status: Never   Smokeless tobacco: Never  Vaping Use   Vaping status: Never Used  Substance Use Topics   Alcohol use: Not Currently    Comment: 2 glasses of wine every 3 months.   Drug use: Never    Review of symptoms:  Constitutional:  Negative for unexplained weight loss, night sweats, fever, chills ENT:  Negative for nose bleeds, sinus pain, painful swallowing CV:  Negative for chest pain, shortness of breath, exercise intolerance, palpitations, loss of consciousness Resp:  Negative for cough, wheezing, shortness of breath GI:  Negative for nausea, vomiting, diarrhea, bloody stools GU:  Positives noted in HPI; otherwise negative for gross hematuria, dysuria, urinary incontinence Neuro:  Negative for seizures, poor balance, limb weakness, slurred speech Psych:  Negative for lack of energy, depression, anxiety Endocrine:  Negative for polydipsia, polyuria, symptoms of hypoglycemia (dizziness, hunger, sweating) Hematologic:  Negative for anemia, purpura, petechia, prolonged or excessive bleeding, use of anticoagulants  Allergic:  Negative for difficulty breathing or choking as a result of exposure to anything; no shellfish allergy; no allergic response (rash/itch) to materials, foods  Physical exam: BP (!) 172/91   Pulse 84   Ht  5' 4 (1.626 m)   Wt 261 lb (118.4 kg)   BMI 44.80 kg/m  GENERAL APPEARANCE:  Well appearing, well developed, well nourished, NAD  GU:  nl ext genitalia.  DRE:  nst, prostate 30gm benign to palpation  Results: Ua- normal

## 2023-11-14 ENCOUNTER — Other Ambulatory Visit: Payer: Self-pay | Admitting: Family Medicine

## 2023-11-14 ENCOUNTER — Other Ambulatory Visit (HOSPITAL_BASED_OUTPATIENT_CLINIC_OR_DEPARTMENT_OTHER): Payer: Self-pay

## 2023-11-14 DIAGNOSIS — G43109 Migraine with aura, not intractable, without status migrainosus: Secondary | ICD-10-CM

## 2023-11-14 MED ORDER — RIZATRIPTAN BENZOATE 10 MG PO TABS
10.0000 mg | ORAL_TABLET | ORAL | 0 refills | Status: DC | PRN
  Filled 2023-11-14: qty 10, 18d supply, fill #0

## 2023-12-10 ENCOUNTER — Ambulatory Visit: Payer: 59 | Admitting: Urology

## 2024-03-21 ENCOUNTER — Other Ambulatory Visit: Payer: Self-pay | Admitting: Family Medicine

## 2024-03-21 DIAGNOSIS — G43109 Migraine with aura, not intractable, without status migrainosus: Secondary | ICD-10-CM

## 2024-03-22 ENCOUNTER — Other Ambulatory Visit: Payer: Self-pay

## 2024-03-22 ENCOUNTER — Other Ambulatory Visit (HOSPITAL_BASED_OUTPATIENT_CLINIC_OR_DEPARTMENT_OTHER): Payer: Self-pay

## 2024-03-22 MED ORDER — FREESTYLE LITE TEST VI STRP
ORAL_STRIP | 3 refills | Status: AC
  Filled 2024-03-22: qty 100, 90d supply, fill #0

## 2024-03-22 MED ORDER — FREESTYLE LANCETS MISC
3 refills | Status: AC
  Filled 2024-03-22: qty 100, 90d supply, fill #0

## 2024-03-22 MED ORDER — RIZATRIPTAN BENZOATE 10 MG PO TABS
10.0000 mg | ORAL_TABLET | ORAL | 0 refills | Status: AC | PRN
  Filled 2024-03-22: qty 10, 18d supply, fill #0

## 2024-03-22 MED ORDER — FREESTYLE LITE W/DEVICE KIT
PACK | 1 refills | Status: AC
  Filled 2024-03-22: qty 1, 90d supply, fill #0
